# Patient Record
Sex: Female | Born: 1935 | Race: Black or African American | Hispanic: No | Smoking: Former smoker
Health system: Southern US, Community
[De-identification: ages and names within clinical notes are randomized; demographics above are authoritative.]

## PROBLEM LIST (undated history)

## (undated) DIAGNOSIS — M5410 Radiculopathy, site unspecified: Secondary | ICD-10-CM

## (undated) DIAGNOSIS — M1712 Unilateral primary osteoarthritis, left knee: Secondary | ICD-10-CM

## (undated) DIAGNOSIS — M47816 Spondylosis without myelopathy or radiculopathy, lumbar region: Secondary | ICD-10-CM

## (undated) DIAGNOSIS — F32A Depression, unspecified: Secondary | ICD-10-CM

## (undated) DIAGNOSIS — Z87442 Personal history of urinary calculi: Secondary | ICD-10-CM

## (undated) DIAGNOSIS — K219 Gastro-esophageal reflux disease without esophagitis: Secondary | ICD-10-CM

## (undated) DIAGNOSIS — F329 Major depressive disorder, single episode, unspecified: Secondary | ICD-10-CM

## (undated) DIAGNOSIS — I10 Essential (primary) hypertension: Secondary | ICD-10-CM

## (undated) DIAGNOSIS — E559 Vitamin D deficiency, unspecified: Secondary | ICD-10-CM

## (undated) DIAGNOSIS — N189 Chronic kidney disease, unspecified: Secondary | ICD-10-CM

## (undated) HISTORY — DX: Major depressive disorder, single episode, unspecified: F32.9

## (undated) HISTORY — PX: EYE SURGERY: SHX253

## (undated) HISTORY — DX: Vitamin D deficiency, unspecified: E55.9

## (undated) HISTORY — PX: COLONOSCOPY: SHX174

## (undated) HISTORY — DX: Essential (primary) hypertension: I10

## (undated) HISTORY — DX: Gastro-esophageal reflux disease without esophagitis: K21.9

## (undated) HISTORY — DX: Depression, unspecified: F32.A

---

## 1967-02-20 HISTORY — PX: BACK SURGERY: SHX140

## 2016-03-16 ENCOUNTER — Ambulatory Visit (INDEPENDENT_AMBULATORY_CARE_PROVIDER_SITE_OTHER): Payer: Medicare Other | Admitting: Internal Medicine

## 2016-03-16 ENCOUNTER — Encounter: Payer: Self-pay | Admitting: Internal Medicine

## 2016-03-16 VITALS — BP 180/86 | HR 59 | Ht 69.5 in | Wt 198.0 lb

## 2016-03-16 DIAGNOSIS — Z01810 Encounter for preprocedural cardiovascular examination: Secondary | ICD-10-CM | POA: Diagnosis not present

## 2016-03-16 DIAGNOSIS — I10 Essential (primary) hypertension: Secondary | ICD-10-CM | POA: Insufficient documentation

## 2016-03-16 DIAGNOSIS — R9431 Abnormal electrocardiogram [ECG] [EKG]: Secondary | ICD-10-CM | POA: Insufficient documentation

## 2016-03-16 NOTE — Patient Instructions (Addendum)
Your physician has requested that you have a lexiscan myoview. For further information please visit [email or web address]. Please follow instruction sheet, as given.  Please contact our office with the name/dose of the other medication you take.  [phone] You can leave this info with the operator  Your physician recommends that you schedule a follow-up appointment as needed - we will call you with your test results.

## 2016-03-16 NOTE — Progress Notes (Signed)
OFFICE NOTE  Chief Complaint:  Preop for left TKR  Primary Care Physician: Patria ManeGASSEMI, MIKE, MD  HPI:  Heather SoursBernice Viscardi is a 83 y.o. female coming referred by Dr. Thurston HoleWainer for evaluation of risk assessment for left total knee replacement. She reports no past cardiac history although has struggled with hypertension. She currently takes benazepril 40 mg and triamterene hydrochlorothiazide 37.5/25 mg daily. She also takes to her Zosyn 2 mg daily at bedtime and metoprolol succinate 100 mg daily. Initially blood pressure is 180/86 today in the office however came down to 140/70. There is no family history of coronary disease. She does not take medication for diabetes or have a history of dyslipidemia. Of note, her EKG is abnormal today showing sinus bradycardia at 59 with T-wave abnormalities laterally suggestive of possible ischemia. She's never had stress testing. Given her knee problem she is unable to walk on a treadmill.  PMHx:  Past Medical History:  Diagnosis Date  . Depression   . GERD (gastroesophageal reflux disease)   . Hypertension   . Vitamin D deficiency     History reviewed. No pertinent surgical history.  FAMHx:  Family History  Problem Relation Age of Onset  . Cancer Mother     SOCHx:   reports that she has never smoked. She has never used smokeless tobacco. She reports that she does not drink alcohol or use drugs.  ALLERGIES:  Allergies  Allergen Reactions  . Amlodipine     Other reaction(s): EDEMA    ROS: Pertinent items noted in HPI and remainder of comprehensive ROS otherwise negative.  HOME MEDS: Allergies as of 03/16/2016      Reactions   Amlodipine    Other reaction(s): EDEMA      Medication List       Accurate as of 03/16/16  5:02 PM. Always use your most recent med list.          acetaminophen 650 MG CR tablet Commonly known as:  TYLENOL Take 650 mg by mouth daily as needed for pain.   aspirin EC 81 MG tablet Take 81 mg by mouth daily.   benazepril 40 MG tablet Commonly known as:  LOTENSIN Take 40 mg by mouth daily.   citalopram 20 MG tablet Commonly known as:  CELEXA Take 20 mg by mouth daily.   meloxicam 7.5 MG tablet Commonly known as:  MOBIC Take 7.5 mg by mouth daily.   metoprolol succinate 100 MG 24 hr tablet Commonly known as:  TOPROL-XL Take 100 mg by mouth daily.   pantoprazole 40 MG tablet Commonly known as:  PROTONIX Take 40 mg by mouth daily.   terazosin 2 MG capsule Commonly known as:  HYTRIN Take 2 mg by mouth daily.   triamterene-hydrochlorothiazide 37.5-25 MG capsule Commonly known as:  DYAZIDE Take 1 capsule by mouth daily.   Vitamin D3 2000 units capsule Take 2,000 Units by mouth daily.      LABS/IMAGING: No results found for this or any previous visit (from the past 48 hour(s)). No results found.  WEIGHTS: Wt Readings from Last 3 Encounters:  03/16/16 198 lb (89.8 kg)    VITALS: BP (!) 180/86 (BP Location: Left Arm)   Pulse (!) 59   Ht 5' 9.5" (1.765 m)   Wt 198 lb (89.8 kg)   BMI 28.82 kg/m   EXAM: General appearance: alert and no distress Neck: no carotid bruit and no JVD Lungs: clear to auscultation bilaterally Heart: regular rate and rhythm Abdomen: soft, non-tender;  bowel sounds normal; no masses,  no organomegaly Extremities: extremities normal, atraumatic, no cyanosis or edema Pulses: 2+ and symmetric Skin: Skin color, texture, turgor normal. No rashes or lesions Neurologic: Grossly normal Psych: Pleasant  EKG: Sinus bradycardia 59, lateral T-wave changes suggestive of ischemia  ASSESSMENT: 1. Indeterminate preoperative risk for left total knee replacement 2. Ischemic EKG changes 3. Hypertension-controlled  PLAN: 1.   Heather Holt has increased risk for coronary disease based on age, hypertension and abnormal EKG with ischemic changes laterally. She denies any chest pain or worsening shortness of breath but has had recent fatigue particular doing  activities and caring for her son is ill as well as a friend. I like for her to undergo a Lexiscan Myoview since she cannot walk on a treadmill. If this is low risk and I would think she is at acceptable risk for total knee replacement. Hopefully we can perform her stress test next week. I'll be in contact with the results of the stress test.  Thanks again for the kind referral.  Chrystie Nose, MD, [facility] Attending Cardiologist [facility]  Lisette Abu [facility] 03/16/2016, 5:02 PM

## 2016-03-21 ENCOUNTER — Telehealth (HOSPITAL_COMMUNITY): Payer: Self-pay

## 2016-03-21 NOTE — Telephone Encounter (Signed)
Encounter complete. 

## 2016-03-22 ENCOUNTER — Telehealth: Payer: Self-pay | Admitting: Internal Medicine

## 2016-03-22 NOTE — Telephone Encounter (Signed)
[facility] requests clearance for:  1. Type of surgery: left total knee replacement 2. Date of surgery: April 30, 2016 3. Surgeon: Dr. Salvatore Marvelobert Wainer 4. Medications that need to be held & how long: none specified 5. Fax and/or Phone: (p) [phone] (f) [phone]  -- attn: Sherri  -- requesting copy of: recent note, EKG, cardiac studies -- stress test scheduled for Mar 23, 2016

## 2016-03-23 ENCOUNTER — Ambulatory Visit (HOSPITAL_COMMUNITY)
Admission: RE | Admit: 2016-03-23 | Discharge: 2016-03-23 | Disposition: A | Discharge status: Home / Self Care | Payer: Medicare Other | Source: Ambulatory Visit | Attending: Cardiology | Admitting: Cardiology

## 2016-03-23 DIAGNOSIS — R9431 Abnormal electrocardiogram [ECG] [EKG]: Secondary | ICD-10-CM | POA: Insufficient documentation

## 2016-03-23 DIAGNOSIS — Z01810 Encounter for preprocedural cardiovascular examination: Secondary | ICD-10-CM | POA: Diagnosis not present

## 2016-03-23 DIAGNOSIS — I10 Essential (primary) hypertension: Secondary | ICD-10-CM | POA: Insufficient documentation

## 2016-03-23 LAB — MYOCARDIAL PERFUSION IMAGING
CHL CUP NUCLEAR SDS: 2
CHL CUP NUCLEAR SRS: 0
CHL CUP NUCLEAR SSS: 2
LV dias vol: 132 mL (ref 46–106)
LVSYSVOL: 58 mL
NUC STRESS TID: 1.03
Peak HR: 73 {beats}/min
Rest HR: 54 {beats}/min

## 2016-03-23 MED ORDER — AMINOPHYLLINE 25 MG/ML IV SOLN
75.0000 mg | Freq: Once | INTRAVENOUS | Status: AC
  Administered 2016-03-23: 75 mg via INTRAVENOUS

## 2016-03-23 MED ORDER — TECHNETIUM TC 99M TETROFOSMIN IV KIT
9.6000 | PACK | Freq: Once | INTRAVENOUS | Status: AC | PRN
  Administered 2016-03-23: 9.6 via INTRAVENOUS
  Filled 2016-03-23: qty 10

## 2016-03-23 MED ORDER — TECHNETIUM TC 99M TETROFOSMIN IV KIT
30.5000 | PACK | Freq: Once | INTRAVENOUS | Status: AC | PRN
  Administered 2016-03-23: 30.5 via INTRAVENOUS
  Filled 2016-03-23: qty 31

## 2016-03-23 MED ORDER — REGADENOSON 0.4 MG/5ML IV SOLN
0.4000 mg | Freq: Once | INTRAVENOUS | Status: AC
  Administered 2016-03-23: 0.4 mg via INTRAVENOUS

## 2016-03-26 ENCOUNTER — Encounter: Payer: Self-pay | Admitting: Internal Medicine

## 2016-03-26 NOTE — Telephone Encounter (Signed)
Notes Recorded by Chrystie NoseKenneth C Hilty, MD on 03/23/2016 at 5:31 PM EST Low risk stress test -no ischemia. Suspected breast attenuation artifact. Normal LV function.

## 2016-03-26 NOTE — Telephone Encounter (Signed)
Based on stress test findings - clear for surgery. Low risk.  Dr. HRexene Edison

## 2016-03-26 NOTE — Telephone Encounter (Signed)
Clearance routed via EPIC 

## 2016-04-18 ENCOUNTER — Encounter (HOSPITAL_COMMUNITY): Payer: Self-pay | Admitting: Physician Assistant

## 2016-04-18 DIAGNOSIS — M1712 Unilateral primary osteoarthritis, left knee: Secondary | ICD-10-CM | POA: Diagnosis present

## 2016-04-18 DIAGNOSIS — M5410 Radiculopathy, site unspecified: Secondary | ICD-10-CM | POA: Diagnosis present

## 2016-04-18 DIAGNOSIS — M47816 Spondylosis without myelopathy or radiculopathy, lumbar region: Secondary | ICD-10-CM | POA: Diagnosis present

## 2016-04-18 DIAGNOSIS — E559 Vitamin D deficiency, unspecified: Secondary | ICD-10-CM | POA: Diagnosis present

## 2016-04-18 DIAGNOSIS — F32A Depression, unspecified: Secondary | ICD-10-CM | POA: Diagnosis present

## 2016-04-18 DIAGNOSIS — F329 Major depressive disorder, single episode, unspecified: Secondary | ICD-10-CM | POA: Diagnosis present

## 2016-04-18 DIAGNOSIS — K219 Gastro-esophageal reflux disease without esophagitis: Secondary | ICD-10-CM | POA: Diagnosis present

## 2016-04-18 NOTE — H&P (Addendum)
TOTAL KNEE ADMISSION H&P  Patient is being admitted for left total knee arthroplasty.  Subjective:  Chief Complaint:left knee pain.  HPI: Heather Holt, 83 y.o. female, has a history of pain and functional disability in the left knee due to arthritis and has failed non-surgical conservative treatments for greater than 12 weeks to includeNSAID's and/or analgesics, corticosteriod injections, viscosupplementation injections, flexibility and strengthening excercises, supervised PT with diminished ADL's post treatment, use of assistive devices, weight reduction as appropriate and activity modification.  Onset of symptoms was gradual, starting 10 years ago with gradually worsening course since that time. The patient noted no past surgery on the left knee(s).  Patient currently rates pain in the left knee(s) at 10 out of 10 with activity. Patient has night pain, worsening of pain with activity and weight bearing, pain that interferes with activities of daily living, crepitus and joint swelling.  Patient has evidence of subchondral sclerosis, periarticular osteophytes and joint space narrowing by imaging studies.  There is no active infection.  Patient Active Problem List   Diagnosis Date Noted  . Primary localized osteoarthrosis of the knee, left   . Radicular syndrome of left leg   . Osteoarthritis of lumbar spine   . Vitamin D deficiency   . GERD (gastroesophageal reflux disease)   . Depression   . Pre-operative cardiovascular examination 03/16/2016  . Essential hypertension, benign 03/16/2016  . Abnormal EKG 03/16/2016   Past Medical History:  Diagnosis Date  . Depression   . GERD (gastroesophageal reflux disease)   . Hypertension   . Osteoarthritis of lumbar spine   . Primary localized osteoarthrosis of the knee, left   . Radicular syndrome of left leg   . Vitamin D deficiency     Past Surgical History:  Procedure Laterality Date  . BACK SURGERY  1969    No current  facility-administered medications for this encounter.   Current Outpatient Prescriptions:  .  benazepril (LOTENSIN) 40 MG tablet, Take 40 mg by mouth daily., Disp: , Rfl:  .  cholecalciferol (VITAMIN D) 1000 units tablet, Take 1,000 Units by mouth daily., Disp: , Rfl:  .  meloxicam (MOBIC) 7.5 MG tablet, Take 7.5 mg by mouth daily., Disp: , Rfl:  .  metoprolol succinate (TOPROL-XL) 100 MG 24 hr tablet, Take 100 mg by mouth daily., Disp: , Rfl:  .  pantoprazole (PROTONIX) 40 MG tablet, Take 40 mg by mouth daily., Disp: , Rfl:  .  Potassium 99 MG TABS, Take 198 mg by mouth daily., Disp: , Rfl:  .  terazosin (HYTRIN) 2 MG capsule, Take 2 mg by mouth at bedtime. , Disp: , Rfl:  .  triamterene-hydrochlorothiazide (DYAZIDE) 37.5-25 MG capsule, Take 1 capsule by mouth daily., Disp: , Rfl:      Allergies  Allergen Reactions  . Amlodipine Swelling    EDEMA    Social History  Substance Use Topics  . Smoking status: Never Smoker  . Smokeless tobacco: Never Used  . Alcohol use No    Family History  Problem Relation Age of Onset  . Cancer Mother   . Diabetes Sister      Review of Systems  Constitutional: Negative.   HENT: Negative.   Eyes: Negative.   Respiratory: Negative.   Cardiovascular: Negative.   Gastrointestinal: Negative.   Genitourinary: Negative.   Musculoskeletal: Positive for back pain and joint pain.  Skin: Negative.   Neurological: Negative.   Endo/Heme/Allergies: Negative.   Psychiatric/Behavioral: Negative.     Objective:  Physical Exam  Constitutional: She is oriented to person, place, and time. She appears well-developed and well-nourished.  HENT:  Head: Normocephalic and atraumatic.  Mouth/Throat: Oropharynx is clear and moist.  Eyes: Conjunctivae are normal. Pupils are equal, round, and reactive to light.  Neck: Normal range of motion. Neck supple.  Cardiovascular: Normal rate and regular rhythm.   Respiratory: Effort normal and breath sounds normal.   GI: Soft. Bowel sounds are normal.  Genitourinary:  Genitourinary Comments: Not pertinent to current symptomatology therefore not examined.  Musculoskeletal:  She is independently ambulatory with a significantly valgus deformed knee on the left.  She has active range of motion 0-90 degrees. 2+ crepitus.  2+ synovitis. Lateral pain.  Ipsilateral straight leg raises on left negative contralateral SLR on right.  Right knee also have valgus deformity with 1+ crepitus, 1+ synovitis and pain today.  1+ DP pulses bilaterally   Neurological: She is alert and oriented to person, place, and time.  Skin: Skin is warm and dry.  Psychiatric: She has a normal mood and affect. Her behavior is normal.    Vital signs in last 24 hours: Temp:  [98.2 F (36.8 C)] 98.2 F (36.8 C) (02/28 1500) Pulse Rate:  [60] 60 (02/28 1500) BP: (192)/(77) 192/77 (02/28 1500) SpO2:  [96 %] 96 % (02/28 1500) Weight:  [89.6 kg (197 lb 9.6 oz)] 89.6 kg (197 lb 9.6 oz) (02/28 1500)  Labs:   Estimated body mass index is 29.18 kg/m as calculated from the following:   Height as of this encounter: 5\' 9"  (1.753 m).   Weight as of this encounter: 89.6 kg (197 lb 9.6 oz).   Imaging Review Plain radiographs demonstrate severe degenerative joint disease of the left knee(s). The overall alignment issignificant valgus. The bone quality appears to be fair for age and reported activity level.  Assessment/Plan:  End stage arthritis, left knee  Principal Problem:   Primary localized osteoarthrosis of the knee, left Active Problems:   Essential hypertension, benign   Abnormal EKG   Radicular syndrome of left leg   Osteoarthritis of lumbar spine   Vitamin D deficiency   GERD (gastroesophageal reflux disease)   Depression   The patient history, physical examination, clinical judgment of the provider and imaging studies are consistent with end stage degenerative joint disease of the left knee(s) and total knee arthroplasty is  deemed medically necessary. The treatment options including medical management, injection therapy arthroscopy and arthroplasty were discussed at length. The risks and benefits of total knee arthroplasty were presented and reviewed. The risks due to aseptic loosening, infection, stiffness, patella tracking problems, thromboembolic complications and other imponderables were discussed. The patient acknowledged the explanation, agreed to proceed with the plan and consent was signed. Patient is being admitted for inpatient treatment for surgery, pain control, PT, OT, prophylactic antibiotics, VTE prophylaxis, progressive ambulation and ADL's and discharge planning. The patient is planning to be discharged home with home health services her son is staying with her.

## 2016-04-20 ENCOUNTER — Encounter (HOSPITAL_COMMUNITY)
Admission: RE | Admit: 2016-04-20 | Discharge: 2016-04-20 | Disposition: A | Discharge status: Home / Self Care | Payer: Medicare Other | Source: Ambulatory Visit | Attending: Orthopedic Surgery | Admitting: Orthopedic Surgery

## 2016-04-20 ENCOUNTER — Other Ambulatory Visit (HOSPITAL_COMMUNITY): Payer: Self-pay | Admitting: *Deleted

## 2016-04-20 ENCOUNTER — Encounter (HOSPITAL_COMMUNITY): Payer: Self-pay

## 2016-04-20 DIAGNOSIS — Z79899 Other long term (current) drug therapy: Secondary | ICD-10-CM | POA: Insufficient documentation

## 2016-04-20 DIAGNOSIS — K219 Gastro-esophageal reflux disease without esophagitis: Secondary | ICD-10-CM | POA: Insufficient documentation

## 2016-04-20 DIAGNOSIS — Z01812 Encounter for preprocedural laboratory examination: Secondary | ICD-10-CM | POA: Insufficient documentation

## 2016-04-20 DIAGNOSIS — Z87891 Personal history of nicotine dependence: Secondary | ICD-10-CM | POA: Insufficient documentation

## 2016-04-20 DIAGNOSIS — I10 Essential (primary) hypertension: Secondary | ICD-10-CM | POA: Diagnosis not present

## 2016-04-20 HISTORY — DX: Personal history of urinary calculi: Z87.442

## 2016-04-20 LAB — COMPREHENSIVE METABOLIC PANEL
ALT: 12 U/L — ABNORMAL LOW (ref 14–54)
ANION GAP: 10 (ref 5–15)
AST: 18 U/L (ref 15–41)
Albumin: 4.2 g/dL (ref 3.5–5.0)
Alkaline Phosphatase: 56 U/L (ref 38–126)
BUN: 18 mg/dL (ref 6–20)
CHLORIDE: 108 mmol/L (ref 101–111)
CO2: 22 mmol/L (ref 22–32)
Calcium: 9.5 mg/dL (ref 8.9–10.3)
Creatinine, Ser: 1.77 mg/dL — ABNORMAL HIGH (ref 0.44–1.00)
GFR, EST AFRICAN AMERICAN: 29 mL/min — AB (ref >60)
GFR, EST NON AFRICAN AMERICAN: 25 mL/min — AB (ref >60)
Glucose, Bld: 97 mg/dL (ref 65–99)
POTASSIUM: 4.1 mmol/L (ref 3.5–5.1)
Sodium: 140 mmol/L (ref 135–145)
Total Bilirubin: 0.4 mg/dL (ref 0.3–1.2)
Total Protein: 7.3 g/dL (ref 6.5–8.1)

## 2016-04-20 LAB — CBC WITH DIFFERENTIAL/PLATELET
Basophils Absolute: 0 10*3/uL (ref 0.0–0.1)
Basophils Relative: 1 %
EOS ABS: 0.1 10*3/uL (ref 0.0–0.7)
EOS PCT: 3 %
HCT: 36.8 % (ref 36.0–46.0)
Hemoglobin: 11.8 g/dL — ABNORMAL LOW (ref 12.0–15.0)
LYMPHS ABS: 1.9 10*3/uL (ref 0.7–4.0)
Lymphocytes Relative: 40 %
MCH: 27.6 pg (ref 26.0–34.0)
MCHC: 32.1 g/dL (ref 30.0–36.0)
MCV: 86 fL (ref 78.0–100.0)
MONO ABS: 0.5 10*3/uL (ref 0.1–1.0)
Monocytes Relative: 10 %
Neutro Abs: 2.2 10*3/uL (ref 1.7–7.7)
Neutrophils Relative %: 46 %
PLATELETS: 220 10*3/uL (ref 150–400)
RBC: 4.28 MIL/uL (ref 3.87–5.11)
RDW: 13.5 % (ref 11.5–15.5)
WBC: 4.7 10*3/uL (ref 4.0–10.5)

## 2016-04-20 LAB — TYPE AND SCREEN
ABO/RH(D): B POS
Antibody Screen: NEGATIVE

## 2016-04-20 LAB — SURGICAL PCR SCREEN
MRSA, PCR: NEGATIVE
Staphylococcus aureus: NEGATIVE

## 2016-04-20 LAB — ABO/RH: ABO/RH(D): B POS

## 2016-04-20 LAB — PROTIME-INR
INR: 1
PROTHROMBIN TIME: 13.2 s (ref 11.4–15.2)

## 2016-04-20 LAB — APTT: aPTT: 30 seconds (ref 24–36)

## 2016-04-20 NOTE — Pre-Procedure Instructions (Signed)
Wynonia SoursBernice Kautzman  04/20/2016    Your procedure is scheduled on  Monday, April 30, 2016 at 10:30 AM.   [facility] at 8:30 AM.   Call this number if you have problems the morning of surgery: [phone]   Questions prior to day of surgery, please call [phone] between 8 & 4 PM.   Remember:  Do not eat food or drink liquids after midnight Sunday, 04/29/16.  Take these medicines the morning of surgery with A SIP OF WATER: Metoprolol (Toprol XL), Pantoprazole (Protonix)  Stop NSAIDS (Meloxicam, Aleve, Ibuprofen, etc) 5 days prior to surgery. Do not use Aspirin products 5 days prior to surgery.   Do not wear jewelry, make-up or nail polish.  Do not wear lotions, powders or perfumes.  Do not shave 48 hours prior to surgery.    Do not bring valuables to the hospital.  [facility] is not responsible for any belongings or valuables.  Contacts, dentures or bridgework may not be worn into surgery.  Leave your suitcase in the car.  After surgery it may be brought to your room.  For patients admitted to the hospital, discharge time will be determined by your treatment team.  Special instructions:  [place] - Preparing for Surgery  Before surgery, you can play an important role.  Because skin is not sterile, your skin needs to be as free of germs as possible.  You can reduce the number of germs on you skin by washing with CHG (chlorahexidine gluconate) soap before surgery.  CHG is an antiseptic cleaner which kills germs and bonds with the skin to continue killing germs even after washing.  Please DO NOT use if you have an allergy to CHG or antibacterial soaps.  If your skin becomes reddened/irritated stop using the CHG and inform your nurse when you arrive at Short Stay.  Do not shave (including legs and underarms) for at least 48 hours prior to the first CHG shower.  You may shave your face.  Please follow these instructions  carefully:   1.  Shower with CHG Soap the night before surgery and the                    morning of Surgery.  2.  If you choose to wash your hair, wash your hair first as usual with your       normal shampoo.  3.  After you shampoo, rinse your hair and body thoroughly to remove the shampoo.  4.  Use CHG as you would any other liquid soap.  You can apply chg directly       to the skin and wash gently with scrungie or a clean washcloth.  5.  Apply the CHG Soap to your body ONLY FROM THE NECK DOWN.        Do not use on open wounds or open sores.  Avoid contact with your eyes, ears, mouth and genitals (private parts).  Wash genitals (private parts) with your normal soap.  6.  Wash thoroughly, paying special attention to the area where your surgery        will be performed.  7.  Thoroughly rinse your body with warm water from the neck down.  8.  DO NOT shower/wash with your normal soap after using and rinsing off       the CHG Soap.  9.  Pat yourself dry with a clean towel.  10.  Wear clean pajamas.            11.  Place clean sheets on your bed the night of your first shower and do not        sleep with pets.  Day of Surgery  Do not apply any lotions the morning of surgery.  Please wear clean clothes to the hospital.   Please read over the fact sheets that you were given.

## 2016-04-20 NOTE — Progress Notes (Signed)
Pt denies cardiac history, chest pain or sob. Saw Dr. Rennis GoldenHilty in February to have cardiac clearance done. Had stress test done then. Cardiac clearance is noted in EPIC.

## 2016-04-21 LAB — URINE CULTURE: Culture: <10000 — AB

## 2016-04-23 ENCOUNTER — Inpatient Hospital Stay (HOSPITAL_COMMUNITY): Payer: Medicare Other | Admitting: Emergency Medicine

## 2016-04-23 NOTE — Progress Notes (Signed)
Anesthesia Chart Review:  Pt is an 83 year old female scheduled for L total knee arthroplasty on 04/30/2016 with Salvatore Marvelobert Wainer, MD.   - PCP is Patria ManeMike Gassemi, MD, who cleared pt for surgery at last office visit 04/18/16 (notes in care everywhere) - Pt saw Zoila ShutterKenneth Hilty, MD with cardiology 03/16/16 for pre-op eval. Stress test ordered, results below.   PMH includes:  HTN, GERD. Former smoker. BMI 29  Medications include: benazepril, metoprolol, protonix, potassium, terazosin, dyazide  Preoperative labs reviewed.  Cr 1.77, BUN 18.  Cr ranged 1.34-1.49 over last 12 months (in care everywhere).  Cr was 1.34 on 03/20/16.  Will recheck renal function DOS.   EKG 03/16/16: sinus bradycardia (59 bpm). T wave abnormality, consider lateral ischemia.   Nuclear stress test 03/23/16:   Nuclear stress EF: 56%.  The left ventricular ejection fraction is normal (55-65%).  There was accentuation of the T wave abnormality in the inferolateral leads during infusion  There is a small defect present in the apical anterior and apical septal location. The defect is partially reversible. This is likely due to variations in breast attenuation artifact.  This is a low risk study.  If labs acceptable DOS, I anticipate pt can proceed as scheduled.   Rica Mastngela Jlyn Cerros, FNP-BC [facility] Phone: [phone] 04/23/2016 4:53 PM

## 2016-04-27 MED ORDER — CEFAZOLIN SODIUM-DEXTROSE 2-4 GM/100ML-% IV SOLN
2.0000 g | INTRAVENOUS | Status: DC
  Filled 2016-04-27: qty 100

## 2016-04-27 MED ORDER — LACTATED RINGERS IV SOLN
INTRAVENOUS | Status: DC
  Administered 2016-04-30: 09:00:00 via INTRAVENOUS

## 2016-04-30 ENCOUNTER — Observation Stay (HOSPITAL_COMMUNITY)
Admission: RE | Admit: 2016-04-30 | Discharge: 2016-05-01 | Disposition: A | Discharge status: Home / Self Care | Payer: Medicare Other | Source: Ambulatory Visit | Attending: Internal Medicine | Admitting: Internal Medicine

## 2016-04-30 ENCOUNTER — Encounter (HOSPITAL_COMMUNITY)
Admission: RE | Disposition: A | Discharge status: Home / Self Care | Payer: Self-pay | Source: Ambulatory Visit | Attending: Family Medicine

## 2016-04-30 ENCOUNTER — Encounter (HOSPITAL_COMMUNITY): Payer: Self-pay | Admitting: *Deleted

## 2016-04-30 DIAGNOSIS — Z791 Long term (current) use of non-steroidal anti-inflammatories (NSAID): Secondary | ICD-10-CM | POA: Insufficient documentation

## 2016-04-30 DIAGNOSIS — R519 Headache, unspecified: Secondary | ICD-10-CM | POA: Diagnosis present

## 2016-04-30 DIAGNOSIS — N182 Chronic kidney disease, stage 2 (mild): Secondary | ICD-10-CM | POA: Diagnosis not present

## 2016-04-30 DIAGNOSIS — M479 Spondylosis, unspecified: Secondary | ICD-10-CM | POA: Insufficient documentation

## 2016-04-30 DIAGNOSIS — I129 Hypertensive chronic kidney disease with stage 1 through stage 4 chronic kidney disease, or unspecified chronic kidney disease: Secondary | ICD-10-CM | POA: Insufficient documentation

## 2016-04-30 DIAGNOSIS — Z9841 Cataract extraction status, right eye: Secondary | ICD-10-CM | POA: Insufficient documentation

## 2016-04-30 DIAGNOSIS — R51 Headache: Secondary | ICD-10-CM | POA: Diagnosis not present

## 2016-04-30 DIAGNOSIS — M1712 Unilateral primary osteoarthritis, left knee: Secondary | ICD-10-CM | POA: Insufficient documentation

## 2016-04-30 DIAGNOSIS — Z79899 Other long term (current) drug therapy: Secondary | ICD-10-CM | POA: Diagnosis not present

## 2016-04-30 DIAGNOSIS — I161 Hypertensive emergency: Secondary | ICD-10-CM | POA: Diagnosis present

## 2016-04-30 DIAGNOSIS — Z9842 Cataract extraction status, left eye: Secondary | ICD-10-CM | POA: Insufficient documentation

## 2016-04-30 DIAGNOSIS — F329 Major depressive disorder, single episode, unspecified: Secondary | ICD-10-CM | POA: Diagnosis not present

## 2016-04-30 DIAGNOSIS — F32A Depression, unspecified: Secondary | ICD-10-CM | POA: Diagnosis present

## 2016-04-30 DIAGNOSIS — Z87891 Personal history of nicotine dependence: Secondary | ICD-10-CM | POA: Diagnosis not present

## 2016-04-30 DIAGNOSIS — M47816 Spondylosis without myelopathy or radiculopathy, lumbar region: Secondary | ICD-10-CM | POA: Diagnosis present

## 2016-04-30 DIAGNOSIS — K219 Gastro-esophageal reflux disease without esophagitis: Secondary | ICD-10-CM | POA: Diagnosis not present

## 2016-04-30 DIAGNOSIS — M47896 Other spondylosis, lumbar region: Secondary | ICD-10-CM | POA: Diagnosis not present

## 2016-04-30 DIAGNOSIS — Z809 Family history of malignant neoplasm, unspecified: Secondary | ICD-10-CM | POA: Insufficient documentation

## 2016-04-30 DIAGNOSIS — Z833 Family history of diabetes mellitus: Secondary | ICD-10-CM | POA: Insufficient documentation

## 2016-04-30 DIAGNOSIS — I160 Hypertensive urgency: Secondary | ICD-10-CM | POA: Diagnosis not present

## 2016-04-30 DIAGNOSIS — Z9889 Other specified postprocedural states: Secondary | ICD-10-CM | POA: Insufficient documentation

## 2016-04-30 DIAGNOSIS — N183 Chronic kidney disease, stage 3 (moderate): Secondary | ICD-10-CM | POA: Diagnosis not present

## 2016-04-30 DIAGNOSIS — N189 Chronic kidney disease, unspecified: Secondary | ICD-10-CM | POA: Diagnosis present

## 2016-04-30 DIAGNOSIS — E559 Vitamin D deficiency, unspecified: Secondary | ICD-10-CM | POA: Insufficient documentation

## 2016-04-30 DIAGNOSIS — Z87442 Personal history of urinary calculi: Secondary | ICD-10-CM | POA: Insufficient documentation

## 2016-04-30 DIAGNOSIS — Z888 Allergy status to other drugs, medicaments and biological substances status: Secondary | ICD-10-CM | POA: Insufficient documentation

## 2016-04-30 DIAGNOSIS — M5410 Radiculopathy, site unspecified: Secondary | ICD-10-CM | POA: Insufficient documentation

## 2016-04-30 HISTORY — DX: Chronic kidney disease, unspecified: N18.9

## 2016-04-30 HISTORY — DX: Spondylosis without myelopathy or radiculopathy, lumbar region: M47.816

## 2016-04-30 HISTORY — DX: Radiculopathy, site unspecified: M54.10

## 2016-04-30 HISTORY — DX: Unilateral primary osteoarthritis, left knee: M17.12

## 2016-04-30 LAB — POCT I-STAT, CHEM 8
BUN: 15 mg/dL (ref 6–20)
Calcium, Ion: 1.16 mmol/L (ref 1.15–1.40)
Chloride: 107 mmol/L (ref 101–111)
Creatinine, Ser: 1.3 mg/dL — ABNORMAL HIGH (ref 0.44–1.00)
GLUCOSE: 101 mg/dL — AB (ref 65–99)
HCT: 35 % — ABNORMAL LOW (ref 36.0–46.0)
HEMOGLOBIN: 11.9 g/dL — AB (ref 12.0–15.0)
POTASSIUM: 3.8 mmol/L (ref 3.5–5.1)
Sodium: 142 mmol/L (ref 135–145)
TCO2: 24 mmol/L (ref 0–100)

## 2016-04-30 LAB — CBC
HCT: 35.5 % — ABNORMAL LOW (ref 36.0–46.0)
HEMOGLOBIN: 11.1 g/dL — AB (ref 12.0–15.0)
MCH: 26.9 pg (ref 26.0–34.0)
MCHC: 31.3 g/dL (ref 30.0–36.0)
MCV: 86.2 fL (ref 78.0–100.0)
PLATELETS: 207 10*3/uL (ref 150–400)
RBC: 4.12 MIL/uL (ref 3.87–5.11)
RDW: 13.9 % (ref 11.5–15.5)
WBC: 5.9 10*3/uL (ref 4.0–10.5)

## 2016-04-30 SURGERY — ARTHROPLASTY, KNEE, TOTAL
Anesthesia: Regional | Laterality: Left

## 2016-04-30 MED ORDER — HYDRALAZINE HCL 20 MG/ML IJ SOLN
10.0000 mg | INTRAMUSCULAR | Status: AC
  Administered 2016-04-30: 10 mg via INTRAVENOUS

## 2016-04-30 MED ORDER — BENAZEPRIL HCL 40 MG PO TABS
40.0000 mg | ORAL_TABLET | ORAL | Status: AC
  Administered 2016-04-30: 40 mg via ORAL
  Filled 2016-04-30: qty 1

## 2016-04-30 MED ORDER — POTASSIUM 99 MG PO TABS: 198.0000 mg | ORAL_TABLET | Freq: Every day | ORAL | Status: DC

## 2016-04-30 MED ORDER — BENAZEPRIL HCL 40 MG PO TABS: 40.0000 mg | ORAL_TABLET | Freq: Every day | ORAL | Status: DC

## 2016-04-30 MED ORDER — MELOXICAM 7.5 MG PO TABS: 7.5000 mg | ORAL_TABLET | Freq: Every day | ORAL | Status: DC

## 2016-04-30 MED ORDER — CHLORHEXIDINE GLUCONATE 4 % EX LIQD: 60.0000 mL | Freq: Once | CUTANEOUS | Status: DC

## 2016-04-30 MED ORDER — ENOXAPARIN SODIUM 40 MG/0.4ML ~~LOC~~ SOLN
40.0000 mg | SUBCUTANEOUS | Status: DC
  Administered 2016-04-30: 40 mg via SUBCUTANEOUS
  Filled 2016-04-30: qty 0.4

## 2016-04-30 MED ORDER — PANTOPRAZOLE SODIUM 40 MG PO TBEC
40.0000 mg | DELAYED_RELEASE_TABLET | Freq: Every day | ORAL | Status: DC
  Administered 2016-05-01: 40 mg via ORAL
  Filled 2016-04-30: qty 1

## 2016-04-30 MED ORDER — MIDAZOLAM HCL 2 MG/2ML IJ SOLN
INTRAMUSCULAR | Status: AC
  Filled 2016-04-30: qty 2

## 2016-04-30 MED ORDER — METOPROLOL SUCCINATE ER 100 MG PO TB24
100.0000 mg | ORAL_TABLET | Freq: Every day | ORAL | Status: DC
  Administered 2016-05-01: 100 mg via ORAL
  Filled 2016-04-30: qty 1

## 2016-04-30 MED ORDER — HYDROCODONE-ACETAMINOPHEN 5-325 MG PO TABS: 1.0000 | ORAL_TABLET | ORAL | Status: DC | PRN

## 2016-04-30 MED ORDER — VITAMIN D 1000 UNITS PO TABS
1000.0000 [IU] | ORAL_TABLET | Freq: Every day | ORAL | Status: DC
  Administered 2016-05-01: 1000 [IU] via ORAL
  Filled 2016-04-30: qty 1

## 2016-04-30 MED ORDER — FENTANYL CITRATE (PF) 100 MCG/2ML IJ SOLN
INTRAMUSCULAR | Status: AC
  Filled 2016-04-30: qty 2

## 2016-04-30 MED ORDER — TERAZOSIN HCL 2 MG PO CAPS
2.0000 mg | ORAL_CAPSULE | Freq: Every day | ORAL | Status: DC
  Administered 2016-04-30: 2 mg via ORAL
  Filled 2016-04-30: qty 1

## 2016-04-30 MED ORDER — BENAZEPRIL HCL 40 MG PO TABS
40.0000 mg | ORAL_TABLET | Freq: Every day | ORAL | Status: DC
  Administered 2016-05-01: 40 mg via ORAL
  Filled 2016-04-30: qty 4
  Filled 2016-04-30: qty 1

## 2016-04-30 MED ORDER — DEXAMETHASONE SODIUM PHOSPHATE 10 MG/ML IJ SOLN
INTRAMUSCULAR | Status: AC
  Filled 2016-04-30: qty 1

## 2016-04-30 MED ORDER — ACETAMINOPHEN 650 MG RE SUPP: 650.0000 mg | Freq: Four times a day (QID) | RECTAL | Status: DC | PRN

## 2016-04-30 MED ORDER — TRIAMTERENE-HCTZ 37.5-25 MG PO CAPS
1.0000 | ORAL_CAPSULE | Freq: Every day | ORAL | Status: DC
  Administered 2016-04-30 – 2016-05-01 (×2): 1 via ORAL
  Filled 2016-04-30 (×2): qty 1

## 2016-04-30 MED ORDER — SODIUM CHLORIDE 0.9% FLUSH
3.0000 mL | Freq: Two times a day (BID) | INTRAVENOUS | Status: DC
  Administered 2016-04-30 – 2016-05-01 (×3): 3 mL via INTRAVENOUS

## 2016-04-30 MED ORDER — POVIDONE-IODINE 7.5 % EX SOLN
Freq: Once | CUTANEOUS | Status: DC
  Filled 2016-04-30: qty 118

## 2016-04-30 MED ORDER — ONDANSETRON HCL 4 MG PO TABS: 4.0000 mg | ORAL_TABLET | Freq: Four times a day (QID) | ORAL | Status: DC | PRN

## 2016-04-30 MED ORDER — HYDRALAZINE HCL 20 MG/ML IJ SOLN: 5.0000 mg | INTRAMUSCULAR | Status: DC | PRN

## 2016-04-30 MED ORDER — ACETAMINOPHEN 325 MG PO TABS: 650.0000 mg | ORAL_TABLET | Freq: Four times a day (QID) | ORAL | Status: DC | PRN

## 2016-04-30 MED ORDER — TRAZODONE 25 MG HALF TABLET
25.0000 mg | ORAL_TABLET | Freq: Every evening | ORAL | Status: DC | PRN
  Filled 2016-04-30 (×2): qty 1

## 2016-04-30 MED ORDER — ONDANSETRON HCL 4 MG/2ML IJ SOLN: 4.0000 mg | Freq: Four times a day (QID) | INTRAMUSCULAR | Status: DC | PRN

## 2016-04-30 NOTE — Interval H&P Note (Signed)
.  H&P done

## 2016-04-30 NOTE — Progress Notes (Addendum)
Patient remains hypertensive above discharge parameter. Spoke to Dr. Renold DonGermeroth. [facility] Renold DonGermeroth. Patient notified of same. Patient complaint of headache.

## 2016-04-30 NOTE — Progress Notes (Signed)
Per Dr. Renold DonGermeroth case cancelled due to BP. Can discharge if BP < 190 SBP and instruct patient not to take benazepril at home.

## 2016-04-30 NOTE — H&P (Signed)
History and Physical    Heather Holt [id number] DOB: 21-May-1930 DOA: 04/30/2016  PCP: Patria ManeGASSEMI, MIKE, MD Patient coming from: home  Chief Complaint: hypertensive emergency  HPI: Heather Holt is a very pleasant 83 y.o. female with medical history significant for vitamin D deficiency, osteoarthritis of the lumbar spine, hypertension, GERD, chronic kidney disease, left knee pain scheduled for a knee replacement today is admitted from postanesthesia care unit secondary to hypertensive emergency.  Information is obtained from the patient and her family his at the bedside. Family indicate patient had epidural injection the lumbar spine last week. She was given instructions to hold one of her medications for 5 days postprocedure. She reports she Haldol other medications for 5 days. Antihypertensive medications include benazepril, metoprolol,triamterene and HCTZ. While in short stay being evaluated preoperatively it was noted her blood pressure was 219/84. Per the chart patient complained of a headache. She was provided with hydralazine and benazepril. At the time of admission blood pressure 214/69 patient was asymptomatic. She denies headache visual disturbances dizziness syncope or near-syncope. She states she ambulated to the bathroom just prior to my arrival and with the exception of pain in her left knee she felt "fine". She denies chest pain palpitation shortness of breath worse remedy edema or orthopnea. She denies abdominal pain nausea vomiting diarrhea constipation melena bright red blood per rectum. She denies dysuria hematuria frequency or urgency.    ED Course: Patient is in the PACU she was provided with denies a pro-and hydralazine. She is afebrile and not hypoxic with a heart rate of 60  Review of Systems: As per HPI otherwise 10 point review of systems negative.   Ambulatory Status: Patient ambulates with a cane and has frequent falls due to osteoarthritis in her knees. She denies  any recent injury  Past Medical History:  Diagnosis Date  . CKD (chronic kidney disease)   . Depression   . GERD (gastroesophageal reflux disease)   . History of kidney stones   . Hypertension   . Osteoarthritis of lumbar spine   . Primary localized osteoarthrosis of the knee, left   . Radicular syndrome of left leg   . Vitamin D deficiency     Past Surgical History:  Procedure Laterality Date  . BACK SURGERY  1969  . COLONOSCOPY    . EYE SURGERY Bilateral    cataract surgery    Social History   Social History  . Marital status: Widowed    Spouse name: N/A  . Number of children: N/A  . Years of education: N/A   Occupational History  . Not on file.   Social History Main Topics  . Smoking status: Former Smoker    Types: Cigarettes    Quit date: 04/21/1986  . Smokeless tobacco: Never Used  . Alcohol use No  . Drug use: No  . Sexual activity: Not on file   Other Topics Concern  . Not on file   Social History Narrative  . No narrative on file    Allergies  Allergen Reactions  . Amlodipine Swelling    SWELLING REACTION, EDEMA UNSPECIFIED    Family History  Problem Relation Age of Onset  . Cancer Mother   . Diabetes Sister     Prior to Admission medications   Medication Sig Start Date End Date Taking? Authorizing Provider  benazepril (LOTENSIN) 40 MG tablet Take 40 mg by mouth daily. 01/31/16  Yes Historical Provider, MD  cholecalciferol (VITAMIN D) 1000 units tablet Take 1,000 Units by  mouth daily.   Yes Historical Provider, MD  meloxicam (MOBIC) 7.5 MG tablet Take 7.5 mg by mouth daily. 01/13/16  Yes Historical Provider, MD  metoprolol succinate (TOPROL-XL) 100 MG 24 hr tablet Take 100 mg by mouth daily. 11/07/15  Yes Historical Provider, MD  pantoprazole (PROTONIX) 40 MG tablet Take 40 mg by mouth daily. 01/23/16  Yes Historical Provider, MD  Potassium 99 MG TABS Take 198 mg by mouth daily.   Yes Historical Provider, MD  terazosin (HYTRIN) 2 MG capsule  Take 2 mg by mouth at bedtime.  02/22/16  Yes Historical Provider, MD  triamterene-hydrochlorothiazide (DYAZIDE) 37.5-25 MG capsule Take 1 capsule by mouth daily. 09/26/15  Yes Historical Provider, MD    Physical Exam: Vitals:   04/30/16 1235 04/30/16 1240 04/30/16 1245 04/30/16 1250  BP:   (!) 198/73   Pulse: 60 64 60   Resp: 19 16 18 15   Temp:      SpO2: 100% 100% 100%   Weight:      Height:         General:  Appears calm and comfortable Sitting on side of the bed Eyes:  PERRL, EOMI, normal lids, iris ENT:  grossly normal hearing, lips & tongue, mmm Neck:  no LAD, masses or thyromegaly Cardiovascular:  RRR, no m/r/g. No LE edema. Pedal pulses present and palpable Respiratory:  CTA bilaterally, no w/r/r. Normal respiratory effort. Abdomen:  soft, ntnd, positive bowel sounds no guarding or rebounding Skin:  no rash or induration seen on limited exam Musculoskeletal:  grossly normal tone BUE/BLE, good ROM, no bony abnormality Psychiatric:  grossly normal mood and affect, speech fluent and appropriate, AOx3 Neurologic:  CN 2-12 grossly intact, moves all extremities in coordinated fashion, sensation intact  Labs on Admission: I have personally reviewed following labs and imaging studies  CBC:  Recent Labs Lab 04/30/16 0857 04/30/16 1155  WBC  --  5.9  HGB 11.9* 11.1*  HCT 35.0* 35.5*  MCV  --  86.2  PLT  --  207   Basic Metabolic Panel:  Recent Labs Lab 04/30/16 0857  NA 142  K 3.8  CL 107  GLUCOSE 101*  BUN 15  CREATININE 1.30*   GFR: Estimated Creatinine Clearance: 39.1 mL/min (by C-G formula based on SCr of 1.3 mg/dL (H)). Liver Function Tests: No results for input(s): AST, ALT, ALKPHOS, BILITOT, PROT, ALBUMIN in the last 168 hours. No results for input(s): LIPASE, AMYLASE in the last 168 hours. No results for input(s): AMMONIA in the last 168 hours. Coagulation Profile: No results for input(s): INR, PROTIME in the last 168 hours. Cardiac Enzymes: No  results for input(s): CKTOTAL, CKMB, CKMBINDEX, TROPONINI in the last 168 hours. BNP (last 3 results) No results for input(s): PROBNP in the last 8760 hours. HbA1C: No results for input(s): HGBA1C in the last 72 hours. CBG: No results for input(s): GLUCAP in the last 168 hours. Lipid Profile: No results for input(s): CHOL, HDL, LDLCALC, TRIG, CHOLHDL, LDLDIRECT in the last 72 hours. Thyroid Function Tests: No results for input(s): TSH, T4TOTAL, FREET4, T3FREE, THYROIDAB in the last 72 hours. Anemia Panel: No results for input(s): VITAMINB12, FOLATE, FERRITIN, TIBC, IRON, RETICCTPCT in the last 72 hours. Urine analysis: No results found for: COLORURINE, APPEARANCEUR, LABSPEC, PHURINE, GLUCOSEU, HGBUR, BILIRUBINUR, KETONESUR, PROTEINUR, UROBILINOGEN, NITRITE, LEUKOCYTESUR  Creatinine Clearance: Estimated Creatinine Clearance: 39.1 mL/min (by C-G formula based on SCr of 1.3 mg/dL (H)).  Sepsis Labs: @LABRCNTIP (procalcitonin:4,lacticidven:4) ) Recent Results (from the past 240 hour(s))  Surgical  pcr screen     Status: None   Collection Time: 04/20/16  2:01 PM  Result Value Ref Range Status   MRSA, PCR NEGATIVE NEGATIVE Final   Staphylococcus aureus NEGATIVE NEGATIVE Final    Comment:        The Xpert SA Assay (FDA approved for NASAL specimens in patients over 82 years of age), is one component of a comprehensive surveillance program.  Test performance has been validated by [facility] for patients greater than or equal to 16 year old. It is not intended to diagnose infection nor to guide or monitor treatment.   Urine culture     Status: Abnormal   Collection Time: 04/20/16  2:01 PM  Result Value Ref Range Status   Specimen Description URINE, CLEAN CATCH  Final   Special Requests NONE  Final   Culture <10,000 COLONIES/mL INSIGNIFICANT GROWTH (A)  Final   Report Status 04/21/2016 FINAL  Final     Radiological Exams on Admission: No results found.  EKG:    Assessment/Plan Principal Problem:   Hypertensive emergency Active Problems:   Primary localized osteoarthrosis of the knee, left   Radicular syndrome of left leg   Osteoarthritis of lumbar spine   Vitamin D deficiency   GERD (gastroesophageal reflux disease)   Depression   Headache   CKD (chronic kidney disease)   #1. Hypertensive emergency. Patient has not taken any of her antihypertensive meds for 5 days. She misunderstood post procedure instructions last week when she had epidural injection in her lumbar spine. Home medications include, benazepril, metoprolol, triamterene and hydrochlorothiazide -We'll continue her home medications -Monitor closely -Likely discharge to home tomorrow  #2. Chronic kidney disease stage II. Creatinine 1.3 on admission. -Monitor urine output -Hold nephrotoxins  #3. Osteoarthritis of the lumbar spine. Table at baseline. She had an epidural injection last week.  4. GERD. Stable at baseline -Continue home meds  #5. Primary localized osteoarthritis of the left knee. Scheduled for a knee replacement today. This was canceled secondary to #1. -Supportive therapy -will need to call orthopedics to reschedule    DVT prophylaxis: scd  Code Status: full  Family Communication: sisters at bedside  Disposition Plan: home  Consults called: none  Admission status: obs    Gwenyth Bender MD [facility]  If 7PM-7AM, please contact night-coverage [email or web address] Password [email or web address]  04/30/2016, 1:01 PM

## 2016-04-30 NOTE — Progress Notes (Signed)
Dr. Renold DonGermeroth notified of BP. At bedside

## 2016-04-30 NOTE — Anesthesia Preprocedure Evaluation (Signed)
Anesthesia Evaluation  Patient identified by MRN, date of birth, ID band Patient awake    Reviewed: Allergy & Precautions, NPO status , Patient's Chart, lab work & pertinent test results  Airway Mallampati: II  TM Distance: >3 FB Neck ROM: Full    Dental no notable dental hx.    Pulmonary neg pulmonary ROS, former smoker,    Pulmonary exam normal breath sounds clear to auscultation       Cardiovascular hypertension, negative cardio ROS Normal cardiovascular exam Rhythm:Regular Rate:Normal     Neuro/Psych negative neurological ROS  negative psych ROS   GI/Hepatic negative GI ROS, Neg liver ROS,   Endo/Other  negative endocrine ROS  Renal/GU negative Renal ROS     Musculoskeletal negative musculoskeletal ROS (+)   Abdominal   Peds  Hematology negative hematology ROS (+)   Anesthesia Other Findings   Reproductive/Obstetrics negative OB ROS                            Anesthesia Physical Anesthesia Plan  ASA: III  Anesthesia Plan: Regional and Spinal   Post-op Pain Management:    Induction:   Airway Management Planned:   Additional Equipment:   Intra-op Plan:   Post-operative Plan:   Informed Consent: I have reviewed the patients History and Physical, chart, labs and discussed the procedure including the risks, benefits and alternatives for the proposed anesthesia with the patient or authorized representative who has indicated his/her understanding and acceptance.   Dental advisory given  Plan Discussed with: CRNA  Anesthesia Plan Comments:         Anesthesia Quick Evaluation

## 2016-05-01 DIAGNOSIS — R51 Headache: Secondary | ICD-10-CM | POA: Diagnosis not present

## 2016-05-01 DIAGNOSIS — I161 Hypertensive emergency: Secondary | ICD-10-CM | POA: Diagnosis not present

## 2016-05-01 DIAGNOSIS — I160 Hypertensive urgency: Secondary | ICD-10-CM | POA: Diagnosis not present

## 2016-05-01 NOTE — Discharge Summary (Signed)
Physician Discharge Summary  Heather SoursBernice Mottley [id number] DOB: [birth year] DOA: 04/30/2016  PCP: Patria ManeGASSEMI, MIKE, MD  Admit date: 04/30/2016 Discharge date: 05/01/2016   Recommendations for Outpatient Follow-Up:   1. Patient accidentally stopped her BP Medications before her knee replacement   Discharge Diagnosis:   Principal Problem:   Hypertensive emergency Active Problems:   Primary localized osteoarthrosis of the knee, left   Radicular syndrome of left leg   Osteoarthritis of lumbar spine   Vitamin D deficiency   GERD (gastroesophageal reflux disease)   Depression   Headache   CKD (chronic kidney disease)   Acute nonintractable headache   Discharge disposition:  Home  Discharge Condition: Improved.  Diet recommendation: Low sodium, heart healthy  Wound care: None.   History of Present Illness:   Heather Holt is a 83 y.o. female who presents for admission from short stay after canceled orthopedic procedure for hypertensive emergency. Pt accidentally stopped all her medications several days ago which resulted in marked symptomatic blood pressure elevation. HA resolved when BP returned to normal range w/ no focal deficits at any time.    Hospital Course by Problem:   Hypertensive emergency. Patient has not taken any of her antihypertensive meds for 5 days. She misunderstood post procedure instructions last week when she had epidural injection in her lumbar spine. Home medications include, benazepril, metoprolol, triamterene and hydrochlorothiazide -continue her home medications -symptoms resolved   Chronic kidney disease stage II. Creatinine 1.3 on admission.    Osteoarthritis of the lumbar spine. Table at baseline. She had an epidural injection last week.  GERD. Stable at baseline -Continue home meds   Primary localized osteoarthritis of the left knee. knee replacement  canceled secondary to high blood pressure -Supportive therapy - orthopedics to  reschedule    Medical Consultants:    None.   Discharge Exam:   Vitals:   05/01/16 0742 05/01/16 1000  BP: (!) 153/76 (!) 153/76  Pulse: 68 84  Resp: 18   Temp: 98.7 F (37.1 C)    Vitals:   05/01/16 0025 05/01/16 0627 05/01/16 0742 05/01/16 1000  BP: (!) 157/75 (!) 150/76 (!) 153/76 (!) 153/76  Pulse: 69 73 68 84  Resp: 18 18 18    Temp: 98.7 F (37.1 C) 98.1 F (36.7 C) 98.7 F (37.1 C)   TempSrc: Oral Oral Oral   SpO2: 97% 96% 95%   Weight:  89.4 kg (197 lb 3.2 oz)    Height:  5\' 9"  (1.753 m)      Gen:  NAD    The results of significant diagnostics from this hospitalization (including imaging, microbiology, ancillary and laboratory) are listed below for reference.     Procedures and Diagnostic Studies:   No results found.   Labs:   Basic Metabolic Panel:  Recent Labs Lab 04/30/16 0857  NA 142  K 3.8  CL 107  GLUCOSE 101*  BUN 15  CREATININE 1.30*   GFR Estimated Creatinine Clearance: 39.1 mL/min (by C-G formula based on SCr of 1.3 mg/dL (H)). Liver Function Tests: No results for input(s): AST, ALT, ALKPHOS, BILITOT, PROT, ALBUMIN in the last 168 hours. No results for input(s): LIPASE, AMYLASE in the last 168 hours. No results for input(s): AMMONIA in the last 168 hours. Coagulation profile No results for input(s): INR, PROTIME in the last 168 hours.  CBC:  Recent Labs Lab 04/30/16 0857 04/30/16 1155  WBC  --  5.9  HGB 11.9* 11.1*  HCT 35.0* 35.5*  MCV  --  86.2  PLT  --  207   Cardiac Enzymes: No results for input(s): CKTOTAL, CKMB, CKMBINDEX, TROPONINI in the last 168 hours. BNP: Invalid input(s): POCBNP CBG: No results for input(s): GLUCAP in the last 168 hours. D-Dimer No results for input(s): DDIMER in the last 72 hours. Hgb A1c No results for input(s): HGBA1C in the last 72 hours. Lipid Profile No results for input(s): CHOL, HDL, LDLCALC, TRIG, CHOLHDL, LDLDIRECT in the last 72 hours. Thyroid function studies No  results for input(s): TSH, T4TOTAL, T3FREE, THYROIDAB in the last 72 hours.  Invalid input(s): FREET3 Anemia work up No results for input(s): VITAMINB12, FOLATE, FERRITIN, TIBC, IRON, RETICCTPCT in the last 72 hours. Microbiology No results found for this or any previous visit (from the past 240 hour(s)).   Discharge Instructions:   Discharge Instructions    Diet - low sodium heart healthy    Complete by:  As directed    Increase activity slowly    Complete by:  As directed      Allergies as of 05/01/2016      Reactions   Amlodipine Swelling   SWELLING REACTION, EDEMA UNSPECIFIED      Medication List    STOP taking these medications   meloxicam 7.5 MG tablet Commonly known as:  MOBIC     TAKE these medications   benazepril 40 MG tablet Commonly known as:  LOTENSIN Take 40 mg by mouth daily.   cholecalciferol 1000 units tablet Commonly known as:  VITAMIN D Take 1,000 Units by mouth daily.   metoprolol succinate 100 MG 24 hr tablet Commonly known as:  TOPROL-XL Take 100 mg by mouth daily.   pantoprazole 40 MG tablet Commonly known as:  PROTONIX Take 40 mg by mouth daily.   Potassium 99 MG Tabs Take 198 mg by mouth daily.   terazosin 2 MG capsule Commonly known as:  HYTRIN Take 2 mg by mouth at bedtime.   triamterene-hydrochlorothiazide 37.5-25 MG capsule Commonly known as:  DYAZIDE Take 1 capsule by mouth daily.         Time coordinating discharge: 35 min  Signed:  Jahson Emanuele U Xenia Nile   [facility] 05/01/2016, 10:13 AM

## 2016-05-01 NOTE — Care Management Obs Status (Signed)
MEDICARE OBSERVATION STATUS NOTIFICATION   Patient Details  Name: Heather Holt MRN: [id number] Date of Birth: 12-29-1930   Medicare Observation Status Notification Given:  Yes    Gala LewandowskyGraves-Bigelow, Tamario Heal Kaye, RN 05/01/2016, 11:07 AM

## 2016-05-01 NOTE — Plan of Care (Signed)
Problem: Pain Managment: Goal: General experience of comfort will improve Outcome: Progressing Patient states discomfort in the knee that she is awaiting surgery on, especially [facility]

## 2016-05-01 NOTE — Progress Notes (Signed)
Patient being discharged home per MD order, All discharge instructions given to patient and family member, Discharge instruction given to patient by Liz BeachGabe, RN.

## 2016-05-01 NOTE — Care Management Note (Signed)
Case Management Note  Patient Details  Name: Heather Holt MRN: [id number] Date of Birth: April 02, 1930  Subjective/Objective:   Pt presented for hypertensive urgency. Pt is from home. PTA independent and pt still drives.                  Action/Plan: No needs from CM at this time.   Expected Discharge Date:  05/01/16               Expected Discharge Plan:  Home/Self Care  In-House Referral:  NA  Discharge planning Services  NA  Post Acute Care Choice:  NA Choice offered to:  NA  DME Arranged:  N/A DME Agency:  NA  HH Arranged:  NA HH Agency:  NA  Status of Service:  Completed, signed off  If discussed at Long Length of Stay Meetings, dates discussed:    Additional Comments:  Gala LewandowskyGraves-Bigelow, Ramy Greth Kaye, RN 05/01/2016, 11:08 AM

## 2016-05-07 ENCOUNTER — Inpatient Hospital Stay: Admit: 2016-05-07 | Payer: Medicare Other | Admitting: Orthopedic Surgery

## 2016-05-07 SURGERY — ARTHROPLASTY, KNEE, TOTAL
Anesthesia: Spinal | Laterality: Left

## 2016-07-12 ENCOUNTER — Other Ambulatory Visit: Payer: Self-pay | Admitting: Physician Assistant

## 2016-07-12 ENCOUNTER — Inpatient Hospital Stay (HOSPITAL_COMMUNITY): Admission: RE | Admit: 2016-07-12 | Payer: Medicare Other | Source: Ambulatory Visit

## 2016-07-19 ENCOUNTER — Encounter (HOSPITAL_COMMUNITY)
Admission: RE | Admit: 2016-07-19 | Discharge: 2016-07-19 | Disposition: A | Discharge status: Home / Self Care | Payer: Medicare Other | Source: Ambulatory Visit | Attending: Orthopedic Surgery | Admitting: Orthopedic Surgery

## 2016-07-19 ENCOUNTER — Encounter (HOSPITAL_COMMUNITY): Payer: Self-pay | Admitting: *Deleted

## 2016-07-19 DIAGNOSIS — Z01812 Encounter for preprocedural laboratory examination: Secondary | ICD-10-CM | POA: Insufficient documentation

## 2016-07-19 DIAGNOSIS — Z01818 Encounter for other preprocedural examination: Secondary | ICD-10-CM | POA: Insufficient documentation

## 2016-07-19 DIAGNOSIS — I129 Hypertensive chronic kidney disease with stage 1 through stage 4 chronic kidney disease, or unspecified chronic kidney disease: Secondary | ICD-10-CM | POA: Diagnosis not present

## 2016-07-19 DIAGNOSIS — N189 Chronic kidney disease, unspecified: Secondary | ICD-10-CM | POA: Insufficient documentation

## 2016-07-19 DIAGNOSIS — Z87891 Personal history of nicotine dependence: Secondary | ICD-10-CM | POA: Diagnosis not present

## 2016-07-19 DIAGNOSIS — Z0183 Encounter for blood typing: Secondary | ICD-10-CM | POA: Insufficient documentation

## 2016-07-19 DIAGNOSIS — K219 Gastro-esophageal reflux disease without esophagitis: Secondary | ICD-10-CM | POA: Diagnosis not present

## 2016-07-19 LAB — CBC WITH DIFFERENTIAL/PLATELET
BASOS ABS: 0 10*3/uL (ref 0.0–0.1)
BASOS PCT: 1 %
Eosinophils Absolute: 0.1 10*3/uL (ref 0.0–0.7)
Eosinophils Relative: 2 %
HCT: 33.7 % — ABNORMAL LOW (ref 36.0–46.0)
HEMOGLOBIN: 10.4 g/dL — AB (ref 12.0–15.0)
LYMPHS PCT: 35 %
Lymphs Abs: 1.8 10*3/uL (ref 0.7–4.0)
MCH: 26.5 pg (ref 26.0–34.0)
MCHC: 30.9 g/dL (ref 30.0–36.0)
MCV: 86 fL (ref 78.0–100.0)
MONO ABS: 0.4 10*3/uL (ref 0.1–1.0)
MONOS PCT: 7 %
NEUTROS PCT: 55 %
Neutro Abs: 2.8 10*3/uL (ref 1.7–7.7)
Platelets: 221 10*3/uL (ref 150–400)
RBC: 3.92 MIL/uL (ref 3.87–5.11)
RDW: 14.2 % (ref 11.5–15.5)
WBC: 5 10*3/uL (ref 4.0–10.5)

## 2016-07-19 LAB — COMPREHENSIVE METABOLIC PANEL
ALK PHOS: 49 U/L (ref 38–126)
ALT: 11 U/L — ABNORMAL LOW (ref 14–54)
AST: 17 U/L (ref 15–41)
Albumin: 3.9 g/dL (ref 3.5–5.0)
Anion gap: 8 (ref 5–15)
BILIRUBIN TOTAL: 0.5 mg/dL (ref 0.3–1.2)
BUN: 19 mg/dL (ref 6–20)
CALCIUM: 9.1 mg/dL (ref 8.9–10.3)
CO2: 21 mmol/L — ABNORMAL LOW (ref 22–32)
CREATININE: 1.53 mg/dL — AB (ref 0.44–1.00)
Chloride: 108 mmol/L (ref 101–111)
GFR calc Af Amer: 35 mL/min — ABNORMAL LOW (ref >60)
GFR, EST NON AFRICAN AMERICAN: 30 mL/min — AB (ref >60)
GLUCOSE: 107 mg/dL — AB (ref 65–99)
Potassium: 4.1 mmol/L (ref 3.5–5.1)
Sodium: 137 mmol/L (ref 135–145)
TOTAL PROTEIN: 7.2 g/dL (ref 6.5–8.1)

## 2016-07-19 LAB — TYPE AND SCREEN
ABO/RH(D): B POS
Antibody Screen: NEGATIVE

## 2016-07-19 LAB — SURGICAL PCR SCREEN
MRSA, PCR: NEGATIVE
Staphylococcus aureus: NEGATIVE

## 2016-07-19 LAB — PROTIME-INR
INR: 1.05
Prothrombin Time: 13.8 seconds (ref 11.4–15.2)

## 2016-07-19 NOTE — Progress Notes (Addendum)
PCP is Dr. Patria ManeMike Gassemi Cardiologist is Dr. Ladon ApplebaumHility saw 03-16-16 Stress test noted from 03-23-16 Surgery was cancelled due to high BP on the day of surgery. See Dr Bo MerinoAngela Kabbe's note from 04-20-16

## 2016-07-19 NOTE — Pre-Procedure Instructions (Signed)
Heather Holt  07/19/2016      [facility] - [place] - [address] [place] [address] Phone: [phone] Fax: [phone]    Your procedure is scheduled on June 11  Report to [facility] Admitting at 645 A.M.  Call this number if you have problems the morning of surgery:  [phone]   Remember:  Do not eat food or drink liquids after midnight.  Take these medicines the morning of surgery with A SIP OF WATER Metoprolol succinate (Toprol-XL), Pantoprazole (Protonix)  Stop taking Asprin, BC's, Goody's, Herbal medications, Ibuprofen, Advil, Motrin, Vitamins, Aleve   Do not wear jewelry, make-up or nail polish.  Do not wear lotions, powders, or perfumes, or deoderant.  Do not shave 48 hours prior to surgery.  Men may shave face and neck.  Do not bring valuables to the hospital.  [facility] is not responsible for any belongings or valuables.  Contacts, dentures or bridgework may not be worn into surgery.  Leave your suitcase in the car.  After surgery it may be brought to your room.  For patients admitted to the hospital, discharge time will be determined by your treatment team.  Patients discharged the day of surgery will not be allowed to drive home.    Special instructions:  [facility] [facility] - Preparing for Surgery  Before surgery, you can play an important role.  Because skin is not sterile, your skin needs to be as free of germs as possible.  You can reduce the number of germs on you skin by washing with CHG (chlorahexidine gluconate) soap before surgery.  CHG is an antiseptic cleaner which kills germs and bonds with the skin to continue killing germs even after washing.  Please DO NOT use if you have an allergy to CHG or antibacterial soaps.  If your skin becomes reddened/irritated stop using the CHG and inform your nurse when you arrive at Short Stay.  Do not shave (including legs and underarms)  for at least 48 hours prior to the first CHG shower.  You may shave your face.  Please follow these instructions carefully:   1.  Shower with CHG Soap the night before surgery and the                                morning of Surgery.  2.  If you choose to wash your hair, wash your hair first as usual with your       normal shampoo.  3.  After you shampoo, rinse your hair and body thoroughly to remove the                      Shampoo.  4.  Use CHG as you would any other liquid soap.  You can apply chg directly       to the skin and wash gently with scrungie or a clean washcloth.  5.  Apply the CHG Soap to your body ONLY FROM THE NECK DOWN.        Do not use on open wounds or open sores.  Avoid contact with your eyes,       ears, mouth and genitals (private parts).  Wash genitals (private parts)       with your normal soap.  6.  Wash thoroughly, paying special attention to the area where your surgery  will be performed.  7.  Thoroughly rinse your body with warm water from the neck down.  8.  DO NOT shower/wash with your normal soap after using and rinsing off       the CHG Soap.  9.  Pat yourself dry with a clean towel.            10.  Wear clean pajamas.            11.  Place clean sheets on your bed the night of your first shower and do not        sleep with pets.  Day of Surgery  Do not apply any lotions/deoderants the morning of surgery.  Please wear clean clothes to the hospital/surgery center.     Please read over the following fact sheets that you were given. Pain Booklet, Coughing and Deep Breathing, MRSA Information and Surgical Site Infection Prevention, Incentive Spirometry

## 2016-07-20 ENCOUNTER — Encounter (HOSPITAL_COMMUNITY): Payer: Self-pay | Admitting: Emergency Medicine

## 2016-07-20 LAB — URINE CULTURE

## 2016-07-20 NOTE — Progress Notes (Signed)
Anesthesia Chart Review:  Pt is an 83 year old female scheduled for L total knee arthroplasty on 07/30/2016 with Salvatore Marvelobert Wainer, MD.   Surgery was originally scheduled for 04/30/16 but was postponed when patient had elevated BP DOS (she had misunderstood pre-op instructions and stopped all BP meds 5 days before surgery)  - PCP is Patria ManeMike Gassemi, MD, who cleared pt for surgery at last office visit 06/28/16 (notes in care everywhere) - Pt saw Zoila ShutterKenneth Hilty, MD with cardiology 03/16/16 for pre-op eval. Stress test ordered, results below.   PMH includes:  HTN, CKD, GERD. Former smoker. BMI 29.5  Medications include: benazepril, metoprolol, protonix, potassium, terazosin, dyazide  BP (!) 161/72   Pulse 63   Temp 36.8 C   Resp 20   Ht 5\' 9"  (1.753 m)   Wt 199 lb 4.8 oz (90.4 kg)   SpO2 98%   BMI 29.43 kg/m    Preoperative labs reviewed.  Cr 1.53, BUN 18. This is consistent with prior results   EKG 03/16/16: sinus bradycardia (59 bpm). T wave abnormality, consider lateral ischemia.   Nuclear stress test 03/23/16:   Nuclear stress EF: 56%.  The left ventricular ejection fraction is normal (55-65%).  There was accentuation of the T wave abnormality in the inferolateral leads during infusion  There is a small defect present in the apical anterior and apical septal location. The defect is partially reversible. This is likely due to variations in breast attenuation artifact.  This is a low risk study.  If no changes, I anticipate pt can proceed with surgery as scheduled.   Rica Mastngela Meyli Boice, FNP-BC [facility]iology Phone: [phone] 07/20/2016 12:49 PM

## 2016-07-23 ENCOUNTER — Inpatient Hospital Stay: Admit: 2016-07-23 | Payer: Medicare Other | Admitting: Orthopedic Surgery

## 2016-07-23 SURGERY — ARTHROPLASTY, KNEE, TOTAL
Anesthesia: Spinal | Laterality: Left

## 2016-07-30 ENCOUNTER — Encounter (HOSPITAL_COMMUNITY): Admission: RE | Payer: Self-pay | Source: Ambulatory Visit

## 2016-07-30 ENCOUNTER — Inpatient Hospital Stay (HOSPITAL_COMMUNITY): Admission: RE | Admit: 2016-07-30 | Payer: Medicare Other | Source: Ambulatory Visit | Admitting: Orthopedic Surgery

## 2016-07-30 SURGERY — ARTHROPLASTY, KNEE, TOTAL
Anesthesia: Spinal | Laterality: Left

## 2019-01-06 IMAGING — NM NM MISC PROCEDURE
6 series · 36 of 36 positions shown · non-contrast
Comparison: none

[Series 1: wbr rest · 6.40mm/px · 6 of 64 frames shown]
[frame 6/64]
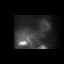
[frame 16/64]
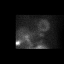
[frame 27/64]
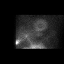
[frame 38/64]
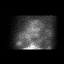
[frame 48/64]
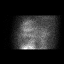
[frame 59/64]
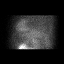

[Series 1: wbr_r-proj_st wbr rest · 6.40mm/px · 6 of 64 frames shown]
[frame 6/64]
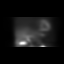
[frame 16/64]
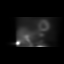
[frame 27/64]
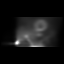
[frame 38/64]
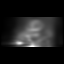
[frame 48/64]
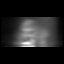
[frame 59/64]
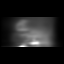

[Series 2: wbr stress-gsp · 6.40mm/px · 6 of 512 frames shown]
[frame 43/512]
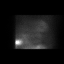
[frame 128/512]
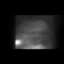
[frame 214/512]
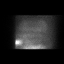
[frame 299/512]
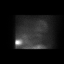
[frame 384/512]
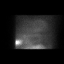
[frame 470/512]
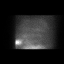

[Series 2: wbr_s-proj_st wbr stress-gsp · 6.40mm/px · 6 of 512 frames shown]
[frame 43/512]
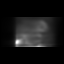
[frame 128/512]
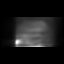
[frame 214/512]
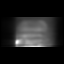
[frame 299/512]
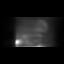
[frame 384/512]
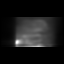
[frame 470/512]
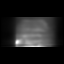

[Series 3: wbr stress-sum-em · 6.40mm/px · 6 of 64 frames shown]
[frame 6/64]
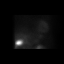
[frame 16/64]
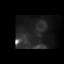
[frame 27/64]
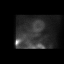
[frame 38/64]
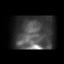
[frame 48/64]
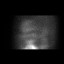
[frame 59/64]
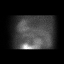

[Series 3: wbr_s-proj_st wbr stress-sum-em · 6.40mm/px · 6 of 64 frames shown]
[frame 6/64]
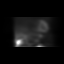
[frame 16/64]
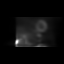
[frame 27/64]
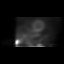
[frame 38/64]
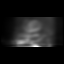
[frame 48/64]
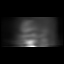
[frame 59/64]
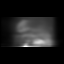

[36 of 36 positions shown; findings below may reference images not displayed]

Canned report from images found in remote index.

Refer to host system for actual result text.

## 2023-11-29 NOTE — Telephone Encounter (Signed)
 This request was faxed to [employer] [facility] [phone].

## 2023-12-01 ENCOUNTER — Encounter (HOSPITAL_COMMUNITY): Payer: Self-pay | Admitting: Neurology

## 2023-12-01 ENCOUNTER — Inpatient Hospital Stay (HOSPITAL_COMMUNITY): Admitting: Anesthesiology

## 2023-12-01 ENCOUNTER — Inpatient Hospital Stay (HOSPITAL_COMMUNITY)
Admission: EM | Admit: 2023-12-01 | Discharge: 2023-12-09 | DRG: 023 | Disposition: A | Discharge status: Hospice | Attending: Neurology | Admitting: Neurology

## 2023-12-01 ENCOUNTER — Inpatient Hospital Stay (HOSPITAL_COMMUNITY)

## 2023-12-01 ENCOUNTER — Emergency Department (HOSPITAL_COMMUNITY)

## 2023-12-01 ENCOUNTER — Encounter (HOSPITAL_COMMUNITY)
Admission: EM | Disposition: A | Discharge status: Hospice | Payer: Self-pay | Source: Home / Self Care | Attending: Neurology

## 2023-12-01 DIAGNOSIS — I129 Hypertensive chronic kidney disease with stage 1 through stage 4 chronic kidney disease, or unspecified chronic kidney disease: Secondary | ICD-10-CM | POA: Diagnosis present

## 2023-12-01 DIAGNOSIS — F32A Depression, unspecified: Secondary | ICD-10-CM | POA: Diagnosis present

## 2023-12-01 DIAGNOSIS — J9602 Acute respiratory failure with hypercapnia: Secondary | ICD-10-CM | POA: Diagnosis present

## 2023-12-01 DIAGNOSIS — G936 Cerebral edema: Secondary | ICD-10-CM | POA: Diagnosis present

## 2023-12-01 DIAGNOSIS — G8194 Hemiplegia, unspecified affecting left nondominant side: Secondary | ICD-10-CM | POA: Diagnosis present

## 2023-12-01 DIAGNOSIS — G935 Compression of brain: Secondary | ICD-10-CM | POA: Diagnosis present

## 2023-12-01 DIAGNOSIS — I4891 Unspecified atrial fibrillation: Secondary | ICD-10-CM | POA: Diagnosis not present

## 2023-12-01 DIAGNOSIS — Z66 Do not resuscitate: Secondary | ICD-10-CM | POA: Diagnosis not present

## 2023-12-01 DIAGNOSIS — M1712 Unilateral primary osteoarthritis, left knee: Secondary | ICD-10-CM | POA: Diagnosis present

## 2023-12-01 DIAGNOSIS — Z809 Family history of malignant neoplasm, unspecified: Secondary | ICD-10-CM

## 2023-12-01 DIAGNOSIS — I4819 Other persistent atrial fibrillation: Secondary | ICD-10-CM | POA: Diagnosis present

## 2023-12-01 DIAGNOSIS — I639 Cerebral infarction, unspecified: Secondary | ICD-10-CM | POA: Diagnosis not present

## 2023-12-01 DIAGNOSIS — Z79899 Other long term (current) drug therapy: Secondary | ICD-10-CM

## 2023-12-01 DIAGNOSIS — Z87891 Personal history of nicotine dependence: Secondary | ICD-10-CM

## 2023-12-01 DIAGNOSIS — M6281 Muscle weakness (generalized): Secondary | ICD-10-CM

## 2023-12-01 DIAGNOSIS — R471 Dysarthria and anarthria: Secondary | ICD-10-CM | POA: Diagnosis present

## 2023-12-01 DIAGNOSIS — I10 Essential (primary) hypertension: Secondary | ICD-10-CM | POA: Diagnosis not present

## 2023-12-01 DIAGNOSIS — E876 Hypokalemia: Secondary | ICD-10-CM | POA: Diagnosis not present

## 2023-12-01 DIAGNOSIS — R911 Solitary pulmonary nodule: Secondary | ICD-10-CM | POA: Diagnosis present

## 2023-12-01 DIAGNOSIS — M47816 Spondylosis without myelopathy or radiculopathy, lumbar region: Secondary | ICD-10-CM | POA: Diagnosis present

## 2023-12-01 DIAGNOSIS — R627 Adult failure to thrive: Secondary | ICD-10-CM | POA: Diagnosis not present

## 2023-12-01 DIAGNOSIS — Z87442 Personal history of urinary calculi: Secondary | ICD-10-CM | POA: Diagnosis not present

## 2023-12-01 DIAGNOSIS — N183 Chronic kidney disease, stage 3 unspecified: Secondary | ICD-10-CM | POA: Diagnosis not present

## 2023-12-01 DIAGNOSIS — Z833 Family history of diabetes mellitus: Secondary | ICD-10-CM

## 2023-12-01 DIAGNOSIS — R29810 Facial weakness: Secondary | ICD-10-CM | POA: Diagnosis present

## 2023-12-01 DIAGNOSIS — I63411 Cerebral infarction due to embolism of right middle cerebral artery: Principal | ICD-10-CM | POA: Diagnosis present

## 2023-12-01 DIAGNOSIS — I6601 Occlusion and stenosis of right middle cerebral artery: Secondary | ICD-10-CM

## 2023-12-01 DIAGNOSIS — N179 Acute kidney failure, unspecified: Secondary | ICD-10-CM | POA: Diagnosis not present

## 2023-12-01 DIAGNOSIS — R400 Somnolence: Secondary | ICD-10-CM | POA: Diagnosis not present

## 2023-12-01 DIAGNOSIS — E559 Vitamin D deficiency, unspecified: Secondary | ICD-10-CM | POA: Diagnosis present

## 2023-12-01 DIAGNOSIS — R29725 NIHSS score 25: Secondary | ICD-10-CM | POA: Diagnosis present

## 2023-12-01 DIAGNOSIS — N1831 Chronic kidney disease, stage 3a: Secondary | ICD-10-CM | POA: Diagnosis present

## 2023-12-01 DIAGNOSIS — I998 Other disorder of circulatory system: Secondary | ICD-10-CM | POA: Diagnosis not present

## 2023-12-01 DIAGNOSIS — I69391 Dysphagia following cerebral infarction: Secondary | ICD-10-CM | POA: Diagnosis not present

## 2023-12-01 DIAGNOSIS — R1310 Dysphagia, unspecified: Secondary | ICD-10-CM | POA: Diagnosis present

## 2023-12-01 DIAGNOSIS — Z7189 Other specified counseling: Secondary | ICD-10-CM | POA: Diagnosis not present

## 2023-12-01 DIAGNOSIS — Z888 Allergy status to other drugs, medicaments and biological substances status: Secondary | ICD-10-CM

## 2023-12-01 DIAGNOSIS — Z515 Encounter for palliative care: Secondary | ICD-10-CM

## 2023-12-01 DIAGNOSIS — Z9911 Dependence on respirator [ventilator] status: Secondary | ICD-10-CM

## 2023-12-01 DIAGNOSIS — E785 Hyperlipidemia, unspecified: Secondary | ICD-10-CM | POA: Diagnosis present

## 2023-12-01 DIAGNOSIS — I63511 Cerebral infarction due to unspecified occlusion or stenosis of right middle cerebral artery: Secondary | ICD-10-CM | POA: Diagnosis present

## 2023-12-01 DIAGNOSIS — I482 Chronic atrial fibrillation, unspecified: Secondary | ICD-10-CM

## 2023-12-01 DIAGNOSIS — I6389 Other cerebral infarction: Secondary | ICD-10-CM | POA: Diagnosis not present

## 2023-12-01 DIAGNOSIS — K219 Gastro-esophageal reflux disease without esophagitis: Secondary | ICD-10-CM | POA: Diagnosis present

## 2023-12-01 DIAGNOSIS — I480 Paroxysmal atrial fibrillation: Secondary | ICD-10-CM

## 2023-12-01 DIAGNOSIS — R233 Spontaneous ecchymoses: Secondary | ICD-10-CM | POA: Diagnosis not present

## 2023-12-01 DIAGNOSIS — I63131 Cerebral infarction due to embolism of right carotid artery: Secondary | ICD-10-CM

## 2023-12-01 HISTORY — PX: RADIOLOGY WITH ANESTHESIA: SHX6223

## 2023-12-01 HISTORY — PX: IR PERCUTANEOUS ART THROMBECTOMY/INFUSION INTRACRANIAL INC DIAG ANGIO: IMG6087

## 2023-12-01 LAB — CBC
HCT: 39.5 % (ref 36.0–46.0)
Hemoglobin: 12.3 g/dL (ref 12.0–15.0)
MCH: 27.7 pg (ref 26.0–34.0)
MCHC: 31.1 g/dL (ref 30.0–36.0)
MCV: 89 fL (ref 80.0–100.0)
Platelets: 158 K/uL (ref 150–400)
RBC: 4.44 MIL/uL (ref 3.87–5.11)
RDW: 14.4 % (ref 11.5–15.5)
WBC: 4.5 K/uL (ref 4.0–10.5)
nRBC: 0 % (ref 0.0–0.2)

## 2023-12-01 LAB — COMPREHENSIVE METABOLIC PANEL WITH GFR
ALT: 16 U/L (ref 0–44)
AST: 22 U/L (ref 15–41)
Albumin: 3.8 g/dL (ref 3.5–5.0)
Alkaline Phosphatase: 61 U/L (ref 38–126)
Anion gap: 12 (ref 5–15)
BUN: 16 mg/dL (ref 8–23)
CO2: 24 mmol/L (ref 22–32)
Calcium: 9.4 mg/dL (ref 8.9–10.3)
Chloride: 105 mmol/L (ref 98–111)
Creatinine, Ser: 1.35 mg/dL — ABNORMAL HIGH (ref 0.44–1.00)
GFR, Estimated: 37 mL/min — ABNORMAL LOW (ref >60)
Glucose, Bld: 149 mg/dL — ABNORMAL HIGH (ref 70–99)
Potassium: 3.4 mmol/L — ABNORMAL LOW (ref 3.5–5.1)
Sodium: 141 mmol/L (ref 135–145)
Total Bilirubin: 1.6 mg/dL — ABNORMAL HIGH (ref 0.0–1.2)
Total Protein: 7.2 g/dL (ref 6.5–8.1)

## 2023-12-01 LAB — DIFFERENTIAL
Abs Immature Granulocytes: 0.01 K/uL (ref 0.00–0.07)
Basophils Absolute: 0 K/uL (ref 0.0–0.1)
Basophils Relative: 0 %
Eosinophils Absolute: 0 K/uL (ref 0.0–0.5)
Eosinophils Relative: 0 %
Immature Granulocytes: 0 %
Lymphocytes Relative: 13 %
Lymphs Abs: 0.6 K/uL — ABNORMAL LOW (ref 0.7–4.0)
Monocytes Absolute: 0.2 K/uL (ref 0.1–1.0)
Monocytes Relative: 5 %
Neutro Abs: 3.6 K/uL (ref 1.7–7.7)
Neutrophils Relative %: 82 %

## 2023-12-01 LAB — I-STAT CHEM 8, ED
BUN: 17 mg/dL (ref 8–23)
Calcium, Ion: 1.17 mmol/L (ref 1.15–1.40)
Chloride: 105 mmol/L (ref 98–111)
Creatinine, Ser: 1.4 mg/dL — ABNORMAL HIGH (ref 0.44–1.00)
Glucose, Bld: 146 mg/dL — ABNORMAL HIGH (ref 70–99)
HCT: 40 % (ref 36.0–46.0)
Hemoglobin: 13.6 g/dL (ref 12.0–15.0)
Potassium: 3.4 mmol/L — ABNORMAL LOW (ref 3.5–5.1)
Sodium: 144 mmol/L (ref 135–145)
TCO2: 25 mmol/L (ref 22–32)

## 2023-12-01 LAB — POCT I-STAT 7, (LYTES, BLD GAS, ICA,H+H)
Acid-base deficit: 1 mmol/L (ref 0.0–2.0)
Bicarbonate: 23.5 mmol/L (ref 20.0–28.0)
Calcium, Ion: 1.22 mmol/L (ref 1.15–1.40)
HCT: 39 % (ref 36.0–46.0)
Hemoglobin: 13.3 g/dL (ref 12.0–15.0)
O2 Saturation: 100 %
Potassium: 3.1 mmol/L — ABNORMAL LOW (ref 3.5–5.1)
Sodium: 143 mmol/L (ref 135–145)
TCO2: 25 mmol/L (ref 22–32)
pCO2 arterial: 36.1 mmHg (ref 32–48)
pH, Arterial: 7.421 (ref 7.35–7.45)
pO2, Arterial: 436 mmHg — ABNORMAL HIGH (ref 83–108)

## 2023-12-01 LAB — MRSA NEXT GEN BY PCR, NASAL: MRSA by PCR Next Gen: NOT DETECTED

## 2023-12-01 LAB — CBG MONITORING, ED: Glucose-Capillary: 131 mg/dL — ABNORMAL HIGH (ref 70–99)

## 2023-12-01 LAB — APTT: aPTT: 29 s (ref 24–36)

## 2023-12-01 LAB — PROTIME-INR
INR: 1.1 (ref 0.8–1.2)
Prothrombin Time: 14.9 s (ref 11.4–15.2)

## 2023-12-01 LAB — ETHANOL: Alcohol, Ethyl (B): <15 mg/dL (ref <15)

## 2023-12-01 SURGERY — RADIOLOGY WITH ANESTHESIA
Anesthesia: General

## 2023-12-01 MED ORDER — SODIUM CHLORIDE 0.9 % IV SOLN: INTRAVENOUS | Status: DC | PRN

## 2023-12-01 MED ORDER — IOHEXOL 350 MG/ML SOLN
100.0000 mL | Freq: Once | INTRAVENOUS | Status: AC | PRN
  Administered 2023-12-01: 100 mL via INTRAVENOUS

## 2023-12-01 MED ORDER — ONDANSETRON HCL 4 MG/2ML IJ SOLN
INTRAMUSCULAR | Status: DC | PRN
Start: 2023-12-01 — End: 2023-12-01
  Administered 2023-12-01: 4 mg via INTRAVENOUS

## 2023-12-01 MED ORDER — SUCCINYLCHOLINE CHLORIDE 200 MG/10ML IV SOSY
PREFILLED_SYRINGE | INTRAVENOUS | Status: DC | PRN
Start: 2023-12-01 — End: 2023-12-01
  Administered 2023-12-01: 120 mg via INTRAVENOUS

## 2023-12-01 MED ORDER — METOPROLOL TARTRATE 5 MG/5ML IV SOLN
INTRAVENOUS | Status: AC
  Filled 2023-12-01: qty 5

## 2023-12-01 MED ORDER — ESMOLOL HCL 100 MG/10ML IV SOLN
INTRAVENOUS | Status: DC | PRN
Start: 2023-12-01 — End: 2023-12-01
  Administered 2023-12-01: 20 mg via INTRAVENOUS
  Administered 2023-12-01: 50 mg via INTRAVENOUS
  Administered 2023-12-01: 30 mg via INTRAVENOUS

## 2023-12-01 MED ORDER — PROPOFOL 1000 MG/100ML IV EMUL
INTRAVENOUS | Status: AC
  Administered 2023-12-01: 5 ug/kg/min via INTRAVENOUS
  Filled 2023-12-01: qty 100

## 2023-12-01 MED ORDER — FENTANYL CITRATE (PF) 50 MCG/ML IJ SOSY: 50.0000 ug | PREFILLED_SYRINGE | INTRAMUSCULAR | Status: DC | PRN

## 2023-12-01 MED ORDER — FENTANYL CITRATE (PF) 250 MCG/5ML IJ SOLN: INTRAMUSCULAR | Status: DC | PRN

## 2023-12-01 MED ORDER — METOPROLOL TARTRATE 5 MG/5ML IV SOLN
INTRAVENOUS | Status: DC | PRN
  Administered 2023-12-01: 2.5 mg via INTRAVENOUS

## 2023-12-01 MED ORDER — ROCURONIUM BROMIDE 10 MG/ML (PF) SYRINGE
PREFILLED_SYRINGE | INTRAVENOUS | Status: DC | PRN
  Administered 2023-12-01: 40 mg via INTRAVENOUS
  Administered 2023-12-01: 10 mg via INTRAVENOUS

## 2023-12-01 MED ORDER — FENTANYL CITRATE (PF) 100 MCG/2ML IJ SOLN
INTRAMUSCULAR | Status: AC
  Filled 2023-12-01: qty 2

## 2023-12-01 MED ORDER — ORAL CARE MOUTH RINSE
15.0000 mL | OROMUCOSAL | Status: DC
  Administered 2023-12-01 – 2023-12-04 (×37): 15 mL via OROMUCOSAL

## 2023-12-01 MED ORDER — FENTANYL CITRATE (PF) 100 MCG/2ML IJ SOLN
INTRAMUSCULAR | Status: DC | PRN
  Administered 2023-12-01: 50 ug via INTRAVENOUS

## 2023-12-01 MED ORDER — ACETAMINOPHEN 325 MG PO TABS
650.0000 mg | ORAL_TABLET | ORAL | Status: DC | PRN
  Administered 2023-12-06: 650 mg via ORAL
  Filled 2023-12-01: qty 2

## 2023-12-01 MED ORDER — ACETAMINOPHEN 650 MG RE SUPP
650.0000 mg | RECTAL | Status: DC | PRN
  Administered 2023-12-05: 650 mg via RECTAL
  Filled 2023-12-01: qty 1

## 2023-12-01 MED ORDER — LIDOCAINE 2% (20 MG/ML) 5 ML SYRINGE
INTRAMUSCULAR | Status: DC | PRN
  Administered 2023-12-01: 20 mg via INTRAVENOUS

## 2023-12-01 MED ORDER — PROPOFOL 10 MG/ML IV BOLUS
INTRAVENOUS | Status: DC | PRN
  Administered 2023-12-01: 50 ug/kg/min via INTRAVENOUS
  Administered 2023-12-01: 100 mg via INTRAVENOUS

## 2023-12-01 MED ORDER — IOHEXOL 300 MG/ML  SOLN: 100.0000 mL | Freq: Once | INTRAMUSCULAR | Status: DC | PRN

## 2023-12-01 MED ORDER — SODIUM CHLORIDE 0.9 % IV BOLUS: 250.0000 mL | INTRAVENOUS | Status: AC | PRN

## 2023-12-01 MED ORDER — PHENYLEPHRINE HCL-NACL 20-0.9 MG/250ML-% IV SOLN
INTRAVENOUS | Status: DC | PRN
  Administered 2023-12-01: 50 ug/min via INTRAVENOUS

## 2023-12-01 MED ORDER — DEXAMETHASONE SOD PHOSPHATE PF 10 MG/ML IJ SOLN
INTRAMUSCULAR | Status: DC | PRN
  Administered 2023-12-01: 5 mg via INTRAVENOUS

## 2023-12-01 MED ORDER — DOCUSATE SODIUM 50 MG/5ML PO LIQD
100.0000 mg | Freq: Two times a day (BID) | ORAL | Status: DC
  Administered 2023-12-02 – 2023-12-03 (×2): 100 mg
  Filled 2023-12-01 (×3): qty 10

## 2023-12-01 MED ORDER — ONDANSETRON HCL 4 MG/2ML IJ SOLN
INTRAMUSCULAR | Status: AC
  Filled 2023-12-01: qty 2

## 2023-12-01 MED ORDER — STROKE: EARLY STAGES OF RECOVERY BOOK
Freq: Once | Status: AC
  Filled 2023-12-01: qty 1

## 2023-12-01 MED ORDER — SODIUM CHLORIDE 0.9 % IV SOLN: INTRAVENOUS | Status: AC

## 2023-12-01 MED ORDER — PROPOFOL 1000 MG/100ML IV EMUL
0.0000 ug/kg/min | INTRAVENOUS | Status: DC
  Filled 2023-12-01: qty 100

## 2023-12-01 MED ORDER — ACETAMINOPHEN 160 MG/5ML PO SOLN: 650.0000 mg | ORAL | Status: DC | PRN

## 2023-12-01 MED ORDER — SODIUM CHLORIDE 0.9% FLUSH: 3.0000 mL | Freq: Once | INTRAVENOUS | Status: DC

## 2023-12-01 MED ORDER — CHLORHEXIDINE GLUCONATE CLOTH 2 % EX PADS
6.0000 | MEDICATED_PAD | Freq: Every day | CUTANEOUS | Status: DC
  Administered 2023-12-02 – 2023-12-06 (×5): 6 via TOPICAL

## 2023-12-01 MED ORDER — CLEVIDIPINE BUTYRATE 0.5 MG/ML IV EMUL
INTRAVENOUS | Status: AC
  Filled 2023-12-01: qty 50

## 2023-12-01 MED ORDER — ORAL CARE MOUTH RINSE: 15.0000 mL | OROMUCOSAL | Status: DC | PRN

## 2023-12-01 MED ORDER — CLEVIDIPINE BUTYRATE 0.5 MG/ML IV EMUL
INTRAVENOUS | Status: DC | PRN
  Administered 2023-12-01: 2 mg/h via INTRAVENOUS

## 2023-12-01 MED ORDER — FENTANYL CITRATE (PF) 50 MCG/ML IJ SOSY
50.0000 ug | PREFILLED_SYRINGE | INTRAMUSCULAR | Status: DC | PRN
  Administered 2023-12-01: 50 ug via INTRAVENOUS
  Administered 2023-12-02: 200 ug via INTRAVENOUS
  Administered 2023-12-02 (×2): 100 ug via INTRAVENOUS
  Administered 2023-12-02: 50 ug via INTRAVENOUS
  Administered 2023-12-02 – 2023-12-03 (×3): 100 ug via INTRAVENOUS
  Administered 2023-12-03: 150 ug via INTRAVENOUS
  Filled 2023-12-01: qty 2
  Filled 2023-12-01: qty 1
  Filled 2023-12-01: qty 2
  Filled 2023-12-01: qty 3
  Filled 2023-12-01: qty 2
  Filled 2023-12-01: qty 1
  Filled 2023-12-01: qty 2
  Filled 2023-12-01: qty 1
  Filled 2023-12-01 (×2): qty 2

## 2023-12-01 MED ORDER — SENNOSIDES-DOCUSATE SODIUM 8.6-50 MG PO TABS: 1.0000 | ORAL_TABLET | Freq: Every evening | ORAL | Status: DC | PRN

## 2023-12-01 MED ORDER — CLEVIDIPINE BUTYRATE 0.5 MG/ML IV EMUL
0.0000 mg/h | INTRAVENOUS | Status: DC
  Administered 2023-12-01 – 2023-12-02 (×2): 8 mg/h via INTRAVENOUS
  Administered 2023-12-02: 6 mg/h via INTRAVENOUS
  Administered 2023-12-02 – 2023-12-03 (×2): 4 mg/h via INTRAVENOUS
  Administered 2023-12-04: 2 mg/h via INTRAVENOUS
  Filled 2023-12-01 (×7): qty 50

## 2023-12-01 MED ORDER — POLYETHYLENE GLYCOL 3350 17 G PO PACK
17.0000 g | PACK | Freq: Every day | ORAL | Status: DC
  Administered 2023-12-03: 17 g
  Filled 2023-12-01 (×2): qty 1

## 2023-12-01 MED ORDER — IOHEXOL 300 MG/ML  SOLN
150.0000 mL | Freq: Once | INTRAMUSCULAR | Status: AC | PRN
  Administered 2023-12-01: 50 mL via INTRA_ARTERIAL

## 2023-12-01 MED ORDER — ONDANSETRON HCL 4 MG/2ML IJ SOLN
4.0000 mg | Freq: Four times a day (QID) | INTRAMUSCULAR | Status: DC | PRN
  Administered 2023-12-01: 4 mg via INTRAVENOUS

## 2023-12-01 NOTE — Brief Op Note (Signed)
  NEUROSURGERY BRIEF THROMBECTOMY NOTE   PREOP DX: RMCA Occlusion  POSTOP DX: Same  PROCEDURE: RMCA thrombectomy  SURGEON: Dino Sable   ANESTHESIA: GETA  EBL: Minimal  Number of Passes: 1  Technique: ASPIRATION WITH STENT RETRIEVER  Final TICI score: 2B  Post OP blood pressure goal: SBP<160  Arterial Angioplasty or Stent: No   Anti-Platelet Therapy: No   COMPLICATIONS: No   CONDITION: Stable to recovery  FINDINGS (Full report in [facility]): 1. RMCA clot. Small residual distal clot.   Heather Holt  @today @ 2:30 PM

## 2023-12-01 NOTE — Consult Note (Signed)
   NAME:  Heather Holt, MRN:  [id number], DOB:  [birth year], LOS: 0 ADMISSION DATE:  12/01/2023, CONSULTATION DATE: 12/01/2023 REFERRING MD: Elida Ross, MD, CHIEF COMPLAINT: Acute stroke  History of Present Illness:   89 year old with CKD, depression, A-fib, GERD, hypertension presenting with acute right MCA stroke.  Intubated for airway protection Status post IR thrombectomy and stent placement.  Pertinent  Medical History    has a past medical history of CKD (chronic kidney disease), Depression, GERD (gastroesophageal reflux disease), History of kidney stones, Hypertension, Osteoarthritis of lumbar spine, Primary localized osteoarthrosis of the knee, left, Radicular syndrome of left leg, and Vitamin D  deficiency.   Significant Hospital Events: Including procedures, antibiotic start and stop dates in addition to other pertinent events     Interim History / Subjective:    Objective    Blood pressure 135/70, pulse 75, resp. rate (!) 8, height 5' 9 (1.753 m), weight 80 kg, SpO2 100%.        Intake/Output Summary (Last 24 hours) at 12/01/2023 1535 Last data filed at 12/01/2023 1449 Gross per 24 hour  Intake 800 ml  Output --  Net 800 ml   Filed Weights   12/01/23 1133  Weight: 80 kg    Examination: Blood pressure 135/70, pulse 75, resp. rate (!) 8, height 5' 9 (1.753 m), weight 80 kg, SpO2 100%. Gen:   Elderly female in no acute distress HEENT:  EOMI, sclera anicteric Neck:     No masses; no thyromegaly, ET tube Lungs:    Clear to auscultation bilaterally; normal respiratory effort CV:         Regular rate and rhythm; no murmurs Abd:      + bowel sounds; soft, non-tender; no palpable masses, no distension Ext:    No edema; adequate peripheral perfusion Neuro: Sedated, unresponsive  Lab/imaging reviewed Significant for potassium 3.4, BUN/creatinine 17/1.40 WBC 5.0, hemoglobin 10.4, platelets 221  Resolved problem list   Assessment and Plan  Acute right MCA  stroke Status post thrombectomy MRI brain, echocardiogram Cleviprex drip for BP control  Vent dependence due to stroke Keep on ventilator for now Assess for SBT's in the morning Check ABG, chest x-ray  Atrial fibrillation Holding Lopressor . Not on anticoagulation as an outpatient Telemetry monitoring  Hypertension Hold outpatient medications  GERD Start PPI  Critical care time:    The patient is critically ill with multiple organ system failure and requires high complexity decision making for assessment and support, frequent evaluation and titration of therapies, advanced monitoring, review of radiographic studies and interpretation of complex data.   Critical Care Time devoted to patient care services, exclusive of separately billable procedures, described in this note is 35 minutes.   Makail Watling MD [facility] See Amion for pager  If no response to pager , please call [phone] until 7pm After 7:00 pm call Elink  [phone] 12/01/2023, 3:55 PM

## 2023-12-01 NOTE — Anesthesia Procedure Notes (Signed)
 Procedure Name: Intubation Date/Time: 12/01/2023 1:11 PM  Performed by: Vertie Arthea RAMAN, CRNAPre-anesthesia Checklist: Patient identified, Emergency Drugs available, Suction available and Patient being monitored Patient Re-evaluated:Patient Re-evaluated prior to induction Oxygen Delivery Method: Circle System Utilized Preoxygenation: Pre-oxygenation with 100% oxygen Induction Type: IV induction Ventilation: Mask ventilation without difficulty Laryngoscope Size: Mac and 3 Tube type: Oral Tube size: 7.0 mm Number of attempts: 1 Airway Equipment and Method: Stylet and Oral airway Placement Confirmation: ETT inserted through vocal cords under direct vision, positive ETCO2 and breath sounds checked- equal and bilateral Tube secured with: Tape Dental Injury: Teeth and Oropharynx as per pre-operative assessment

## 2023-12-01 NOTE — Progress Notes (Signed)
 eLink Physician-Brief Progress Note Patient Name: Heather Holt DOB: 1930-08-30 MRN: [id number]   Date of Service  12/01/2023  HPI/Events of Note  on vent with prop at 70, very dyssynch on vent , asking for fentanyl  either drip or prn  eICU Interventions  Will start with PRN fentanyl       Intervention Category Major Interventions: Respiratory failure - evaluation and management  Raynor Calcaterra G Zionna Homewood 12/01/2023, 9:37 PM

## 2023-12-01 NOTE — Anesthesia Postprocedure Evaluation (Signed)
 Anesthesia Post Note  Patient: Heather Holt  Procedure(s) Performed: RADIOLOGY WITH ANESTHESIA     Patient location during evaluation: ICU Anesthesia Type: General Level of consciousness: sedated and patient remains intubated per anesthesia plan Pain management: pain level controlled Vital Signs Assessment: post-procedure vital signs reviewed and stable Respiratory status: patient remains intubated per anesthesia plan and patient on ventilator - see flowsheet for VS Cardiovascular status: stable (BP controlled with Cleviprex) Postop Assessment: no apparent nausea or vomiting Anesthetic complications: no Comments: Pt remains intubated and sedated   No notable events documented.  Last Vitals:  Vitals:   12/01/23 1200 12/01/23 1230  BP: (!) 174/104 (!) 190/96  Pulse: (!) 104 93  Resp: 14 (!) 35  SpO2: 100% 96%    Last Pain: There were no vitals filed for this visit.               Jacqlyn Marolf,E. Carmello Cabiness

## 2023-12-01 NOTE — Progress Notes (Signed)
 1500:  Patient transferred from IR to 4NICU intubated.  Bedside report received from IR RN and CRNA.  Groin site intact with palpable pulses.  Family at bedside and returned patient's belonging to son (belonging include rings and clothing).  CCM notified.

## 2023-12-01 NOTE — Transfer of Care (Signed)
 Immediate Anesthesia Transfer of Care Note  Patient: Heather Holt  Procedure(s) Performed: RADIOLOGY WITH ANESTHESIA  Patient Location: ICU  Anesthesia Type:General  Level of Consciousness: sedated and unresponsive  Airway & Oxygen Therapy: Patient remains intubated per anesthesia plan and Patient placed on Ventilator (see vital sign flow sheet for setting)  Post-op Assessment: Report given to RN and Post -op Vital signs reviewed and stable  Post vital signs: Reviewed and stable  Last Vitals:  Vitals Value Taken Time  BP 151/83 12/01/23 15:00  Temp    Pulse 87 12/01/23 15:00  Resp 9 12/01/23 15:00  SpO2 100 % 12/01/23 15:00  Vitals shown include unfiled device data.  Last Pain: There were no vitals filed for this visit.       Complications: No notable events documented.

## 2023-12-01 NOTE — Code Documentation (Signed)
 Stroke Response Nurse Documentation Code Documentation  Heather Holt is a 89 y.o. female arriving to [facility]  via [facility] on 12/01/23 with past medical hx of CKD, depression, persistent afib, GERD, HTN, osteoarthritis, vitamin D  deficiency . On Pradaxa but unsure of last time taken. Code stroke was activated by EMS.   Patient from home where she was LKW at 8pm last night and now complaining of right gaze, left sided weakness. When pt did not show up to church, friends did a wellness check on her and found her in her bed minimally responsive with right sided gaze and left weakness.   Stroke team at the bedside on patient arrival. Labs drawn and patient cleared for CT by Dr. Ginger. Patient to CT with team. NIHSS 25, see documentation for details and code stroke times.  The following imaging was completed:  CT Head, CTA, and CTP. Patient is not a candidate for IV Thrombolytic due to outside window. Patient is a candidate for IR due to LVO positive.   Care Plan: To IR, report given to IR RN- CC  Process Delays Noted: Difficult IV access   Bedside handoff with ED RN.    Nat Blunt  Stroke Response RN

## 2023-12-01 NOTE — Anesthesia Preprocedure Evaluation (Addendum)
 Anesthesia Evaluation  Patient identified by MRN, date of birth, ID band Patient awake  General Assessment Comment:Pt awake, yet aphasic and unable to respond  Reviewed: Allergy & Precautions, Patient's Chart, lab work & pertinent test results, reviewed documented beta blocker date and time , Unable to perform ROS - Chart review onlyPreop documentation limited or incomplete due to emergent nature of procedure.  Airway Mallampati: Unable to assess  TM Distance: >3 FB Neck ROM: Limited    Dental  (+) Dental Advisory Given   Pulmonary former smoker   breath sounds clear to auscultation       Cardiovascular hypertension, Pt. on medications and Pt. on home beta blockers  Rhythm:Irregular Rate:Tachycardia  '18 Stress: Nuclear stress EF: 56%. The left ventricular EF (55-65%). There was accentuation of the T wave abnormality in the inferolateral leads during infusion There is a small defect present in the apical anterior and apical septal location. The defect is partially reversible. This is likely due to variations in breast attenuation artifact. This is a low risk study.      Neuro/Psych CVA (acute CVA)    GI/Hepatic ,GERD  Medicated,,  Endo/Other  BMI 26  Renal/GU Renal InsufficiencyRenal disease     Musculoskeletal  (+) Arthritis ,    Abdominal   Peds  Hematology Hb 13.6, plt 158k   Anesthesia Other Findings   Reproductive/Obstetrics                              Anesthesia Physical Anesthesia Plan  ASA: 4 and emergent  Anesthesia Plan: General   Post-op Pain Management: Minimal or no pain anticipated   Induction: Intravenous and Rapid sequence  PONV Risk Score and Plan: 3  Airway Management Planned: Oral ETT  Additional Equipment: None  Intra-op Plan:   Post-operative Plan: Post-operative intubation/ventilation  Informed Consent:      Only emergency history available  and History available from chart only  Plan Discussed with: CRNA and Surgeon  Anesthesia Plan Comments: (No notes from ED, IR team/MD not present, pt non-communicative)         Anesthesia Quick Evaluation

## 2023-12-01 NOTE — H&P (Signed)
 NEUROLOGY H&P NOTE   Date of service: December 01, 2023 Patient Name: Heather Holt MRN:  [id number] DOB:  06/27/1930 Chief Complaint: Code Stroke  History of Present Illness  Heather Holt is a 89 y.o. female with hx of CKD, depression, persistent afib, GERD, HTN, osteoarthritis, vitamin D  deficiency presenting as a code stroke via EMS. She was last seen in her usual state of health last night around 8pm. This morning she did not show up for church and her family called a well fair check. Police and fire had to break down her door and she was found on her bed. In afib on arrival to ED with SBP 160, glucose 131. It appears that her pradaxa was denied per notes from [facility] and EMS was unable to find it when they looked through her medications.    Last known well: 10/11 8pm Modified rankin score: 0-Completely asymptomatic and back to baseline post- stroke IV Thrombolysis: No, outside of the window Thrombectomy: Yes, phone consent obtained via 3 way phone call with son Heather Holt HCPOA Delay due to IV access  NIHSS components Score: Comment  1a Level of Conscious 0[]  1[x]  2[]  3[]      1b LOC Questions 0[]  1[]  2[x]       1c LOC Commands 0[]  1[]  2[x]       2 Best Gaze 0[]  1[]  2[x]       3 Visual 0[]  1[]  2[x]  3[]      4 Facial Palsy 0[]  1[]  2[x]  3[]      5a Motor Arm - left 0[]  1[]  2[]  3[]  4[x]  UN[]    5b Motor Arm - Right 0[x]  1[]  2[]  3[]  4[]  UN[]    6a Motor Leg - Left 0[]  1[]  2[]  3[x]  4[]  UN[]    6b Motor Leg - Right 0[]  1[x]  2[]  3[]  4[]  UN[]    7 Limb Ataxia 0[x]  1[]  2[]  UN[]      8 Sensory 0[]  1[x]  2[]  UN[]      9 Best Language 0[]  1[]  2[x]  3[]      10 Dysarthria 0[]  1[x]  2[]  UN[]      11 Extinct. and Inattention 0[]  1[]  2[x]       TOTAL:25       ROS   Unable to perform due to altered mental status   Past History   Past Medical History:  Diagnosis Date   CKD (chronic kidney disease)    Depression    GERD (gastroesophageal reflux disease)    History of kidney stones    Hypertension     Osteoarthritis of lumbar spine    Primary localized osteoarthrosis of the knee, left    Radicular syndrome of left leg    Vitamin D  deficiency    Past Surgical History:  Procedure Laterality Date   BACK SURGERY  1969   COLONOSCOPY     EYE SURGERY Bilateral    cataract surgery   Family History  Problem Relation Age of Onset   Cancer Mother    Diabetes Sister    Social History   Socioeconomic History   Marital status: Widowed    Spouse name: Not on file   Number of children: Not on file   Years of education: Not on file   Highest education level: Not on file  Occupational History   Not on file  Tobacco Use   Smoking status: Former    Current packs/day: 0.00    Types: Cigarettes    Quit date: 04/21/1986    Years since quitting: 37.6   Smokeless tobacco: Never  Vaping Use  Vaping status: Never Used  Substance and Sexual Activity   Alcohol use: No   Drug use: No   Sexual activity: Not on file  Other Topics Concern   Not on file  Social History Narrative   Not on file   Social Drivers of Health   Financial Resource Strain: Low Risk  (08/29/2018)   Received from [facility] visits prior to 04/21/2022.   Overall Financial Resource Strain (CARDIA)    Difficulty of Paying Living Expenses: Not hard at all  Food Insecurity: Low Risk  (10/17/2023)   Received from [facility]   Hunger Vital Sign    Within the past 12 months, you worried that your food would run out before you got money to buy more: Never true    Within the past 12 months, the food you bought just didn't last and you didn't have money to get more. : Never true  Transportation Needs: No Transportation Needs (10/17/2023)   Received from [facility]    In the past 12 months, has lack of reliable transportation kept you from medical appointments, meetings, work or from getting things needed for daily living? : No  Physical Activity: Not on file  Stress: Not on file   Social Connections: Not on file   Allergies  Allergen Reactions   Amlodipine Swelling and Other (See Comments)    SWELLING REACTION, EDEMA UNSPECIFIED    Medications   Current Facility-Administered Medications:    [START ON 12/02/2023]  stroke: early stages of recovery book, , Does not apply, Once, Remi Pippin, NP   0.9 %  sodium chloride  infusion, , Intravenous, Continuous, Shafer, Devon, NP   acetaminophen  (TYLENOL ) tablet 650 mg, 650 mg, Oral, Q4H PRN **OR** acetaminophen  (TYLENOL ) 160 MG/5ML solution 650 mg, 650 mg, Per Tube, Q4H PRN **OR** acetaminophen  (TYLENOL ) suppository 650 mg, 650 mg, Rectal, Q4H PRN, Remi, Devon, NP   ondansetron  (ZOFRAN ) 4 MG/2ML injection, , , ,    ondansetron  (ZOFRAN ) injection 4 mg, 4 mg, Intravenous, Q6H PRN, Remi, Devon, NP, 4 mg at 12/01/23 1218   senna-docusate (Senokot-S) tablet 1 tablet, 1 tablet, Oral, QHS PRN, Remi Pippin, NP   sodium chloride  flush (NS) 0.9 % injection 3 mL, 3 mL, Intravenous, Once, Tegeler, Lonni PARAS, MD  Current Outpatient Medications:    benazepril  (LOTENSIN ) 40 MG tablet, Take 40 mg by mouth daily., Disp: , Rfl:    cholecalciferol  (VITAMIN D ) 1000 units tablet, Take 1,000 Units by mouth daily., Disp: , Rfl:    HYDROcodone -acetaminophen  (NORCO/VICODIN) 5-325 MG tablet, Take 1 tablet by mouth every 8 (eight) hours as needed for moderate pain., Disp: , Rfl:    metoprolol  succinate (TOPROL -XL) 100 MG 24 hr tablet, Take 100 mg by mouth daily., Disp: , Rfl:    pantoprazole  (PROTONIX ) 40 MG tablet, Take 40 mg by mouth daily., Disp: , Rfl:    terazosin  (HYTRIN ) 2 MG capsule, Take 2 mg by mouth at bedtime. , Disp: , Rfl:    triamterene -hydrochlorothiazide  (DYAZIDE ) 37.5-25 MG capsule, Take 1 capsule by mouth daily., Disp: , Rfl:    Vitals   Vitals:   12/01/23 1133 12/01/23 1200 12/01/23 1230  BP:  (!) 174/104 (!) 190/96  Pulse:  (!) 104 93  Resp:  14 (!) 35  SpO2:  100% 96%  Weight: 80 kg       Body mass index  is 26.05 kg/m.  Physical Exam   Constitutional: Appears well-developed and well-nourished.  Psych: Affect  appropriate to situation.  Eyes: No scleral injection.  HENT: No OP obstruction.  Head: Normocephalic.  Cardiovascular: tachycardic, in afib on monitor Respiratory: Effort normal, non-labored breathing.  GI: Soft.  No distension. There is no tenderness.  Skin: WDI.   Neurologic Examination    Neuro: Mental Status: Patient is awake, alert, states I'm fine but did not answer orientation questions  Speech is dysarthric, does not name objects, but does speak spontaneously  Left sided neglect Cranial Nerves: II: Does not blink to threat consistently on the left III,IV, VI: Forced right gaze V: Facial sensation is symmetric to temperature VII: right facial droop  VIII: Hearing is intact to voice X: Palate elevates symmetrically XI: Shoulder shrug is symmetric. XII: Tongue protrudes midline without atrophy or fasciculations.  Motor: Bulk is normal.  RUE 5/5 LUE 0/5 with increased tone  RLE 4/5 LLE 2/5 with increased tone  Sensory: Withdraws less on the left than the right to noxious stimuli  Cerebellar: FNF and HKS are intact on the right  Unable to complete on the left    Labs   CBC:  Recent Labs  Lab 12/01/23 1128 12/01/23 1135  WBC 4.5  --   NEUTROABS 3.6  --   HGB 12.3 13.6  HCT 39.5 40.0  MCV 89.0  --   PLT 158  --    Basic Metabolic Panel:  Lab Results  Component Value Date   NA 144 12/01/2023   K 3.4 (L) 12/01/2023   CO2 24 12/01/2023   GLUCOSE 146 (H) 12/01/2023   BUN 17 12/01/2023   CREATININE 1.40 (H) 12/01/2023   CALCIUM 9.4 12/01/2023   GFRNONAA 37 (L) 12/01/2023   GFRAA 35 (L) 07/19/2016   Lipid Panel: No results found for: LDLCALC HgbA1c: No results found for: HGBA1C Urine Drug Screen: No results found for: LABOPIA, COCAINSCRNUR, LABBENZ, AMPHETMU, THCU, LABBARB  Alcohol Level     Component Value Date/Time   [facility]  <15 12/01/2023 1128   INR  Lab Results  Component Value Date   INR 1.1 12/01/2023   APTT  Lab Results  Component Value Date   APTT 29 12/01/2023     CT Head without contrast(Personally reviewed): 1. Acute right MCA territory infarct.  ASPECTS 6/10 2. No hyperdense vessel. 3. Atherosclerotic calcifications in the cavernous carotid arteries bilaterally. 4. Right periorbital soft tissue swelling without underlying fracture or foreign body  CT angio Head and Neck with contrast(Personally reviewed): 1. Distal right M1 occlusion with poor collateralization of the anterior and superior divisions. 2. Core infarct of 13 ml in the right frontal operculum and anterior right frontal lobe. 3. Larger area of ischemia in the right MCA territory measuring 94 ml, with a mismatch volume of 81 ml and a mismatch ratio of 7.2.  Assessment   Heather Holt is a 89 y.o. female with a hx of CKD, depression, persistent afib, GERD, HTN, osteoarthritis, vitamin D  deficiency presenting with left sided weakness, right gaze, dysarthria. CT with right MCA territory infarct, aspects 6.  Favorable perfusion and after discussion with on neuro radiologist and family the decision was made to proceed with EVT. Plan to admit to 4N ICU post procedure.   Primary Diagnosis:  Cerebral infarction due to embolism of  right middle cerebral artery.   Secondary Diagnosis: Essential (primary) hypertension, Chronic atrial fibrillation, and CKD Stage 3 (GFR 30-59)  Recommendations   -HgbA1c, fasting lipid panel - MRI of the brain without contrast - Frequent neuro checks - BP goal per  IR  - Echocardiogram - Prophylactic therapy-will likely need to resume anticoagulation  - Risk factor modification - Telemetry monitoring - PT consult, OT consult, Speech consult  ______________________________________________________________________   Signed, Jorene Last, NP [facility]  Risks, benefits and alternatives  of IVT discussed with patient and/or family and they agreed.   Attending Neurohospitalist Addendum Patient seen and examined with APP/Resident. Agree with the history and physical as documented above. Agree with the plan as documented, which I helped formulate. I have edited the note above to reflect my full findings and recommendations. I have independently reviewed the chart, obtained history, review of systems and examined the patient.I have personally reviewed pertinent head/neck/spine imaging (CT/MRI). Please feel free to call with any questions.  This patient is critically ill and at significant risk of neurological worsening, death and care requires constant monitoring of vital signs, hemodynamics,respiratory and cardiac monitoring, neurological assessment, discussion with family, other specialists and medical decision making of high complexity. I spent 95 minutes of neurocritical care time  in the care of  this patient. This was time spent independent of any time provided by nurse practitioner or PA.  Heather Ross, MD [facility] [phone]  If 7pm- 7am, please page neurology on call as listed in AMION.

## 2023-12-01 NOTE — ED Provider Notes (Signed)
 Heather Holt EMERGENCY DEPARTMENT AT [facility] Provider Note   CSN: [id number] Arrival date & time: 12/01/23  1124     Patient presents with: No chief complaint on file.   Heather Holt is a 89 y.o. female.   The history is provided by the EMS personnel and medical records. No language interpreter was used.  Neurologic Problem This is a new problem. Episode onset: last normal 8pm. The problem occurs constantly. The problem has not changed since onset.Pertinent negatives include no chest pain, no abdominal pain, no headaches and no shortness of breath. Nothing aggravates the symptoms. Nothing relieves the symptoms. She has tried nothing for the symptoms.       Prior to Admission medications   Medication Sig Start Date End Date Taking? Authorizing Provider  benazepril  (LOTENSIN ) 40 MG tablet Take 40 mg by mouth daily. 01/31/16   [provider]  cholecalciferol  (VITAMIN D ) 1000 units tablet Take 1,000 Units by mouth daily.    [provider]  HYDROcodone -acetaminophen  (NORCO/VICODIN) 5-325 MG tablet Take 1 tablet by mouth every 8 (eight) hours as needed for moderate pain.    [provider]  metoprolol  succinate (TOPROL -XL) 100 MG 24 hr tablet Take 100 mg by mouth daily. 11/07/15   [provider]  pantoprazole  (PROTONIX ) 40 MG tablet Take 40 mg by mouth daily. 01/23/16   [provider]  terazosin  (HYTRIN ) 2 MG capsule Take 2 mg by mouth at bedtime.  02/22/16   [provider]  triamterene -hydrochlorothiazide  (DYAZIDE ) 37.5-25 MG capsule Take 1 capsule by mouth daily. 09/26/15   [provider]    Allergies: Amlodipine    Review of Systems  Unable to perform ROS: Acuity of condition  Respiratory:  Negative for shortness of breath.   Cardiovascular:  Negative for chest pain.  Gastrointestinal:  Negative for abdominal pain.  Neurological:  Positive for facial asymmetry, speech difficulty and weakness. Negative  for headaches.    Updated Vital Signs Wt 80 kg   BMI 26.05 kg/m   Physical Exam Vitals and nursing note reviewed.  Constitutional:      General: She is not in acute distress.    Appearance: She is well-developed. She is ill-appearing. She is not diaphoretic.  HENT:     Head: Atraumatic.     Nose: No congestion or rhinorrhea.     Mouth/Throat:     Mouth: Mucous membranes are moist.     Pharynx: No oropharyngeal exudate or posterior oropharyngeal erythema.  Eyes:     Conjunctiva/sclera: Conjunctivae normal.  Cardiovascular:     Rate and Rhythm: Normal rate and regular rhythm.     Heart sounds: No murmur heard. Pulmonary:     Effort: Pulmonary effort is normal. No respiratory distress.     Breath sounds: Normal breath sounds. No wheezing, rhonchi or rales.  Chest:     Chest wall: No tenderness.  Abdominal:     Palpations: Abdomen is soft.     Tenderness: There is no abdominal tenderness.  Musculoskeletal:        General: No swelling or tenderness.     Cervical back: Neck supple.  Skin:    General: Skin is warm and dry.     Capillary Refill: Capillary refill takes less than 2 seconds.  Neurological:     Mental Status: She is alert.     Cranial Nerves: Dysarthria and facial asymmetry present.     Sensory: Sensory deficit present.     Motor: Weakness present.  Comments: Patient has right gaze preference, dysarthria, left face, left arm, left leg numbness and weakness.  Psychiatric:        Mood and Affect: Mood normal.     (all labs ordered are listed, but only abnormal results are displayed) Labs Reviewed  DIFFERENTIAL - Abnormal; Notable for the following components:      Result Value   Lymphs Abs 0.6 (*)    All other components within normal limits  COMPREHENSIVE METABOLIC PANEL WITH GFR - Abnormal; Notable for the following components:   Potassium 3.4 (*)    Glucose, Bld 149 (*)    Creatinine, Ser 1.35 (*)    Total Bilirubin 1.6 (*)    GFR, Estimated 37 (*)     All other components within normal limits  I-STAT CHEM 8, ED - Abnormal; Notable for the following components:   Potassium 3.4 (*)    Creatinine, Ser 1.40 (*)    Glucose, Bld 146 (*)    All other components within normal limits  CBG MONITORING, ED - Abnormal; Notable for the following components:   Glucose-Capillary 131 (*)    All other components within normal limits  PROTIME-INR  APTT  CBC  ETHANOL    EKG: None  Radiology: CT HEAD CODE STROKE WO CONTRAST Result Date: 12/01/2023 EXAM: CT HEAD WITHOUT CONTRAST 12/01/2023 11:35:11 AM TECHNIQUE: CT of the head was performed without the administration of intravenous contrast. Automated exposure control, iterative reconstruction, and/or weight based adjustment of the mA/kV was utilized to reduce the radiation dose to as low as reasonably achievable. COMPARISON: 07/06/2016 CLINICAL HISTORY: Neuro deficit, acute, stroke suspected. FINDINGS: BRAIN AND VENTRICLES: No acute hemorrhage. Loss of gray-white differentiation is present in the supratentorial right frontal lobe and in the right frontal operculum. The anterior right insular cortex is obscured. The right lentiform nucleus is isoechoic to white matter. The caudate head is intact. Atrophy and other white matter changes demonstrate some suspected progression. Atherosclerotic calcifications are present in the cavernous carotid arteries bilaterally. No hyperdense vessel is present. No extra-axial collection. No mass effect or midline shift. No hydrocephalus. ORBITS: Right periorbital soft tissue swelling is present without underlying fracture or foreign body. Bilateral lens replacements are noted. The globes and orbits are otherwise within normal limits. SINUSES: No acute abnormality. SOFT TISSUES AND SKULL: Right periorbital soft tissue swelling is present without underlying fracture or foreign body. A left supraorbital scalp lipoma is present. No skull fracture. ASPECT Score: Ganglionic  (caudate, ic, lentiform nucleus, insula, M1-m3): 2 Supraganglionic (m4-m6): 4 Total: 6 IMPRESSION: 1. Acute right MCA territory infarct.  ASPECTS 6/10 2. No hyperdense vessel. 3. Atherosclerotic calcifications in the cavernous carotid arteries bilaterally. 4. Right periorbital soft tissue swelling without underlying fracture or foreign body. Question of trauma? Critical findings were called to Dr. Matthews at 11:40 am. Electronically signed by: Lonni Necessary MD 12/01/2023 11:46 AM EDT RP Workstation: [id number]     Procedures   EMERGENCY DEPARTMENT  US  GUIDANCE EXAM Emergency Ultrasound:  US  Guidance for Needle Guidance  INDICATIONS: Difficult vascular access Linear probe used in real-time to visualize location of needle entry through skin.   PERFORMED BY: Myself IMAGES ARCHIVED?: No LIMITATIONS: poor veins VIEWS USED: Transverse INTERPRETATION: Needle visualized within vein, Left arm, and Needle gauge 18   CRITICAL CARE Performed by: Lonni PARAS Brinley Treanor Total critical care time: 35 minutes Critical care time was exclusive of separately billable procedures and treating other patients. Critical care was necessary to treat or prevent imminent or life-threatening deterioration.  Critical care was time spent personally by me on the following activities: development of treatment plan with patient and/or surrogate as well as nursing, discussions with consultants, evaluation of patient's response to treatment, examination of patient, obtaining history from patient or surrogate, ordering and performing treatments and interventions, ordering and review of laboratory studies, ordering and review of radiographic studies, pulse oximetry and re-evaluation of patient's condition.  Medications Ordered in the ED  sodium chloride  flush (NS) 0.9 % injection 3 mL (has no administration in time range)  iohexol (OMNIPAQUE) 350 MG/ML injection 100 mL (100 mLs Intravenous Contrast Given 12/01/23 1207)                                     Medical Decision Making Amount and/or Complexity of Data Reviewed Labs: ordered. Radiology: ordered.    Heather Holt is a 89 y.o. female with a past medical history significant for hypertension, GERD, previous kidney stones, CKD, osteoarthritis, and possible dabigatran use who presents as a code stroke.  According to EMS report, patient was last normal at 8 PM going to bed last night and then this morning was unresponsive when family did a well check to go to church.  Patient has left-sided deficits, left-sided neglect, and has a rightward gaze preference.  Patient is saying some answers to questions but does not answer questions all appropriately.  She does have right facial droop on arrival.  Patient quickly taken to CT scanner for imaging as we are concerned about stroke or LVO.  On my brief exam, patient does not did have weakness in left arm left leg and left face.  Decree sensation on the left side.  Right gaze preference.  Pupils are symmetric and reactive however.  Speech is somewhat dysarthric.  Otherwise lungs clear and chest nontender.  Abdomen nontender.  Ultrasound-guided IV was placed by me as IVs Blowing.  Anticipate admission for acute stroke after workup is completed.  Neurology speaking to family to determine management plan.  12:37 PM Was informed by neurology that patient is now a code IR and will go to the interventional radiology suite for possible intracranial intervention.  They will admit to the ICU for further management after.      Final diagnoses:  Cerebrovascular accident (CVA), unspecified mechanism (HCC)  Acute left-sided muscle weakness     Clinical Impression: 1. Cerebrovascular accident (CVA), unspecified mechanism (HCC)   2. Acute left-sided muscle weakness     Disposition: Admit  This note was prepared with assistance of Dragon voice recognition software. Occasional wrong-word or sound-a-like  substitutions may have occurred due to the inherent limitations of voice recognition software.      Abygayle Deltoro, Lonni PARAS, MD 12/01/23 1242

## 2023-12-02 ENCOUNTER — Inpatient Hospital Stay (HOSPITAL_COMMUNITY)

## 2023-12-02 ENCOUNTER — Other Ambulatory Visit (HOSPITAL_COMMUNITY): Payer: Self-pay

## 2023-12-02 ENCOUNTER — Telehealth (HOSPITAL_COMMUNITY): Payer: Self-pay | Admitting: Pharmacy Technician

## 2023-12-02 ENCOUNTER — Encounter (HOSPITAL_COMMUNITY): Payer: Self-pay | Admitting: Radiology

## 2023-12-02 DIAGNOSIS — G936 Cerebral edema: Secondary | ICD-10-CM

## 2023-12-02 DIAGNOSIS — I4819 Other persistent atrial fibrillation: Secondary | ICD-10-CM

## 2023-12-02 DIAGNOSIS — I63511 Cerebral infarction due to unspecified occlusion or stenosis of right middle cerebral artery: Secondary | ICD-10-CM | POA: Diagnosis not present

## 2023-12-02 DIAGNOSIS — I6389 Other cerebral infarction: Secondary | ICD-10-CM | POA: Diagnosis not present

## 2023-12-02 DIAGNOSIS — R233 Spontaneous ecchymoses: Secondary | ICD-10-CM | POA: Diagnosis not present

## 2023-12-02 DIAGNOSIS — I10 Essential (primary) hypertension: Secondary | ICD-10-CM | POA: Diagnosis not present

## 2023-12-02 DIAGNOSIS — K219 Gastro-esophageal reflux disease without esophagitis: Secondary | ICD-10-CM | POA: Diagnosis not present

## 2023-12-02 DIAGNOSIS — E785 Hyperlipidemia, unspecified: Secondary | ICD-10-CM

## 2023-12-02 DIAGNOSIS — I63411 Cerebral infarction due to embolism of right middle cerebral artery: Secondary | ICD-10-CM | POA: Diagnosis not present

## 2023-12-02 DIAGNOSIS — I4891 Unspecified atrial fibrillation: Secondary | ICD-10-CM | POA: Diagnosis not present

## 2023-12-02 LAB — CBC
HCT: 39 % (ref 36.0–46.0)
Hemoglobin: 12.5 g/dL (ref 12.0–15.0)
MCH: 27.7 pg (ref 26.0–34.0)
MCHC: 32.1 g/dL (ref 30.0–36.0)
MCV: 86.3 fL (ref 80.0–100.0)
Platelets: 163 K/uL (ref 150–400)
RBC: 4.52 MIL/uL (ref 3.87–5.11)
RDW: 14.7 % (ref 11.5–15.5)
WBC: 10.3 K/uL (ref 4.0–10.5)
nRBC: 0 % (ref 0.0–0.2)

## 2023-12-02 LAB — LIPID PANEL
Cholesterol: 126 mg/dL (ref 0–200)
HDL: 49 mg/dL (ref >40)
LDL Cholesterol: 61 mg/dL (ref 0–99)
Total CHOL/HDL Ratio: 2.6 ratio
Triglycerides: 80 mg/dL (ref <150)
VLDL: 16 mg/dL (ref 0–40)

## 2023-12-02 LAB — BASIC METABOLIC PANEL WITH GFR
Anion gap: 12 (ref 5–15)
BUN: 15 mg/dL (ref 8–23)
CO2: 22 mmol/L (ref 22–32)
Calcium: 9.3 mg/dL (ref 8.9–10.3)
Chloride: 107 mmol/L (ref 98–111)
Creatinine, Ser: 1.15 mg/dL — ABNORMAL HIGH (ref 0.44–1.00)
GFR, Estimated: 45 mL/min — ABNORMAL LOW (ref >60)
Glucose, Bld: 152 mg/dL — ABNORMAL HIGH (ref 70–99)
Potassium: 3.1 mmol/L — ABNORMAL LOW (ref 3.5–5.1)
Sodium: 141 mmol/L (ref 135–145)

## 2023-12-02 LAB — ECHOCARDIOGRAM COMPLETE
AR max vel: 2.5 cm2
AV Area VTI: 2.73 cm2
AV Area mean vel: 2.29 cm2
AV Mean grad: 5 mmHg
AV Peak grad: 10.4 mmHg
Ao pk vel: 1.61 m/s
Area-P 1/2: 4.65 cm2
Calc EF: 65.7 %
Height: 69 in
MV VTI: 3.81 cm2
S' Lateral: 2.8 cm
Single Plane A2C EF: 63.6 %
Single Plane A4C EF: 64.6 %
Weight: 2821.89 [oz_av]

## 2023-12-02 LAB — MAGNESIUM: Magnesium: 1.8 mg/dL (ref 1.7–2.4)

## 2023-12-02 LAB — HEMOGLOBIN A1C
Hgb A1c MFr Bld: 5.5 % (ref 4.8–5.6)
Mean Plasma Glucose: 111.15 mg/dL

## 2023-12-02 MED ORDER — FENTANYL CITRATE (PF) 100 MCG/2ML IJ SOLN
INTRAMUSCULAR | Status: AC
  Filled 2023-12-02: qty 2

## 2023-12-02 MED ORDER — POTASSIUM CHLORIDE 10 MEQ/100ML IV SOLN
10.0000 meq | INTRAVENOUS | Status: AC
  Administered 2023-12-02 (×6): 10 meq via INTRAVENOUS
  Filled 2023-12-02 (×6): qty 100

## 2023-12-02 MED ORDER — TORSEMIDE 20 MG PO TABS
20.0000 mg | ORAL_TABLET | Freq: Every day | ORAL | Status: AC
Start: 2023-12-02 — End: ?
  Administered 2023-12-03 – 2023-12-04 (×2): 20 mg
  Filled 2023-12-02 (×5): qty 1

## 2023-12-02 MED ORDER — MAGNESIUM SULFATE 2 GM/50ML IV SOLN
2.0000 g | Freq: Once | INTRAVENOUS | Status: AC
  Administered 2023-12-02: 2 g via INTRAVENOUS
  Filled 2023-12-02: qty 50

## 2023-12-02 MED ORDER — POTASSIUM CHLORIDE 20 MEQ PO PACK
60.0000 meq | PACK | Freq: Once | ORAL | Status: DC
  Filled 2023-12-02: qty 3

## 2023-12-02 MED ORDER — ATORVASTATIN CALCIUM 10 MG PO TABS
20.0000 mg | ORAL_TABLET | Freq: Every day | ORAL | Status: DC
  Administered 2023-12-03 – 2023-12-04 (×2): 20 mg
  Filled 2023-12-02 (×3): qty 2

## 2023-12-02 MED ORDER — PANTOPRAZOLE SODIUM 40 MG IV SOLR
40.0000 mg | INTRAVENOUS | Status: DC
  Administered 2023-12-02 – 2023-12-06 (×5): 40 mg via INTRAVENOUS
  Filled 2023-12-02 (×4): qty 10

## 2023-12-02 MED ORDER — CARVEDILOL 12.5 MG PO TABS
12.5000 mg | ORAL_TABLET | Freq: Two times a day (BID) | ORAL | Status: DC
  Administered 2023-12-02 – 2023-12-04 (×4): 12.5 mg
  Filled 2023-12-02 (×4): qty 1

## 2023-12-02 MED ORDER — HYALURONIDASE HUMAN 150 UNIT/ML IJ SOLN
150.0000 [IU] | Freq: Once | INTRAMUSCULAR | Status: AC
  Administered 2023-12-02: 150 [IU] via SUBCUTANEOUS
  Filled 2023-12-02: qty 1

## 2023-12-02 MED ORDER — SERTRALINE HCL 50 MG PO TABS
25.0000 mg | ORAL_TABLET | Freq: Every day | ORAL | Status: AC
Start: 2023-12-02 — End: ?
  Administered 2023-12-03 – 2023-12-04 (×2): 25 mg
  Filled 2023-12-02 (×3): qty 1

## 2023-12-02 MED ORDER — POTASSIUM CHLORIDE 10 MEQ/100ML IV SOLN
10.0000 meq | INTRAVENOUS | Status: DC
Start: 2023-12-02 — End: 2023-12-02
  Administered 2023-12-02 (×3): 10 meq via INTRAVENOUS
  Filled 2023-12-02 (×6): qty 100

## 2023-12-02 NOTE — Progress Notes (Incomplete)
 Attending note: I have seen and examined the patient. History, labs and imaging reviewed.  Blood pressure (!) 147/79, pulse 100, temperature 98.4 F (36.9 C), temperature source Oral, resp. rate 18, height 5' 9 (1.753 m), weight 80 kg, SpO2 100%. Gen:      No acute distress HEENT:  EOMI, sclera anicteric Neck:     No masses; no thyromegaly Lungs:    Clear to auscultation bilaterally; normal respiratory effort*** CV:         Regular rate and rhythm; no murmurs Abd:      + bowel sounds; soft, non-tender; no palpable masses, no distension Ext:    No edema; adequate peripheral perfusion Neuro: alert and oriented x 3 Psych: normal mood and affect   Labs/Imaging personally reviewed, significant for   Assessment/plan: Rt MCA stroke S/p thrombectomy MRI today  PSV weans and assess for extubation  The patient is critically ill with multiple organ systems failure and requires high complexity decision making for assessment and support, frequent evaluation and titration of therapies, application of advanced monitoring technologies and extensive interpretation of multiple databases.  Critical care time - 35 mins. This represents my time independent of the NPs time taking care of the pt.  Mohammad Granade MD  Pulmonary and Critical Care 12/02/2023, 10:44 AM

## 2023-12-02 NOTE — Progress Notes (Signed)
 eLink Physician-Brief Progress Note Patient Name: Heather Holt DOB: 12/04/1930 MRN: [id number]   Date of Service  12/02/2023  HPI/Events of Note  Notified of Kcl infiltration. Pharmacy asked for Hyaluronidase protocol   eICU Interventions  Order placed Continue to monitor site     Intervention Category Minor Interventions: Routine modifications to care plan (e.g. PRN medications for pain, fever)  Kasie Leccese G Avielle Imbert 12/02/2023, 4:44 AM

## 2023-12-02 NOTE — Progress Notes (Addendum)
 STROKE TEAM PROGRESS NOTE    SIGNIFICANT HOSPITAL EVENTS 10/12- Code stroke, IR   INTERIM HISTORY/SUBJECTIVE MRI and MRA today show right MCA territory infarct with some petechial hemorrhage and interval recannulization of the right M1.  She did require Seve some sedation for MRI so she has not been able to wean.  Did talk to family and they are wanting her to be a DNR however they have not made a decision yet regarding reintubation. Prior to receiving sedation she was following commands on the right upper and lower extremity.  Withdraws to noxious stimuli minimally in the left upper extremity and in the left lower extremity. CT head tomorrow prior to resuming anticoagulation.   OBJECTIVE  CBC    Component Value Date/Time   WBC 4.5 12/01/2023 1128   RBC 4.44 12/01/2023 1128   HGB 13.3 12/01/2023 1545   HCT 39.0 12/01/2023 1545   PLT 158 12/01/2023 1128   MCV 89.0 12/01/2023 1128   MCH 27.7 12/01/2023 1128   MCHC 31.1 12/01/2023 1128   RDW 14.4 12/01/2023 1128   LYMPHSABS 0.6 (L) 12/01/2023 1128   MONOABS 0.2 12/01/2023 1128   EOSABS 0.0 12/01/2023 1128   BASOSABS 0.0 12/01/2023 1128    BMET    Component Value Date/Time   NA 141 12/02/2023 0012   K 3.1 (L) 12/02/2023 0012   CL 107 12/02/2023 0012   CO2 22 12/02/2023 0012   GLUCOSE 152 (H) 12/02/2023 0012   BUN 15 12/02/2023 0012   CREATININE 1.15 (H) 12/02/2023 0012   CALCIUM 9.3 12/02/2023 0012   GFRNONAA 45 (L) 12/02/2023 0012    IMAGING past 24 hours CT ANGIO HEAD NECK W WO CM W PERF (CODE STROKE) Result Date: 12/01/2023 EXAM: CTA Head and Neck with Perfusion 12/01/2023 12:06:42 PM TECHNIQUE: CTA of the head and neck was performed without and with the administration of 100 mL of iohexol (OMNIPAQUE) 350 MG/ML injection. 3D postprocessing with multiplanar reconstructions and MIPs was performed to evaluate the vascular anatomy. Cerebral perfusion analysis using computed tomography with contrast administration,  including post-processing of parametric maps with determination of cerebral blood flow, cerebral blood volume, mean transit time and time-to-maximum. Automated exposure control, iterative reconstruction, and/or weight based adjustment of the mA/kV was utilized to reduce the radiation dose to as low as reasonably achievable. COMPARISON: None available CLINICAL HISTORY: Neuro deficit, acute, stroke suspected. FINDINGS: CTA NECK: AORTIC ARCH AND ARCH VESSELS: Atherosclerotic changes are present at the aortic arch and great vessel origins. No focal stenosis or aneurysm is present. CERVICAL CAROTID ARTERIES: No dissection, arterial injury, or hemodynamically significant stenosis by NASCET criteria. CERVICAL VERTEBRAL ARTERIES: Atherosclerotic calcifications are present bilaterally without significant stenosis. The left vertebral artery is dominant. No dissection or arterial injury. LUNGS AND MEDIASTINUM: Unremarkable. SOFT TISSUES: No acute abnormality. BONES: Multilevel degenerative changes are present in the cervical spine. CTA HEAD: ANTERIOR CIRCULATION: Atherosclerotic changes are present within the cavernous internal carotid arteries bilaterally without significant stenoses through the ICA termina. No significant stenosis of the anterior cerebral arteries. A distal right M1 occlusion is present. The posterior demonstrated branch vessels are opacified. Anterior and superior MCA branches are not opacified. Poor collateralization is present. No aneurysm. POSTERIOR CIRCULATION: No significant stenosis of the posterior cerebral arteries. No significant stenosis of the basilar artery. No significant stenosis of the vertebral arteries. No aneurysm. OTHER: A prominent right posterior communicating artery contributes. No dural venous sinus thrombosis on this non-dedicated study. CT PERFUSION: EXAM QUALITY: Exam quality is adequate with  diagnostic perfusion maps. No significant motion artifact. Appropriate arterial inflow and  venous outflow curves. CORE INFARCT (CBF<30% volume): 13 mL in the right frontal opaculum and anterior right frontal lobe. TOTAL HYPOPERFUSION (Tmax>6s volume): 94 mL across the right MCA territory. PENUMBRA: Mismatch volume: 81 mL Mismatch ratio: 7.2 Location: Right MCA territory. IMPRESSION: 1. Distal right M1 occlusion with poor collateralization of the anterior and superior divisions. 2. Core infarct of 13 ml in the right frontal operculum and anterior right frontal lobe. 3. Larger area of ischemia in the right MCA territory measuring 94 ml, with a mismatch volume of 81 ml and a mismatch ratio of 7.2. Clinical findings were called to Dr. Matthews at 12:10 pm. Electronically signed by: Lonni Necessary MD 12/01/2023 12:21 PM EDT RP Workstation: [id number]   CT HEAD CODE STROKE WO CONTRAST Result Date: 12/01/2023 EXAM: CT HEAD WITHOUT CONTRAST 12/01/2023 11:35:11 AM TECHNIQUE: CT of the head was performed without the administration of intravenous contrast. Automated exposure control, iterative reconstruction, and/or weight based adjustment of the mA/kV was utilized to reduce the radiation dose to as low as reasonably achievable. COMPARISON: 07/06/2016 CLINICAL HISTORY: Neuro deficit, acute, stroke suspected. FINDINGS: BRAIN AND VENTRICLES: No acute hemorrhage. Loss of gray-white differentiation is present in the supratentorial right frontal lobe and in the right frontal operculum. The anterior right insular cortex is obscured. The right lentiform nucleus is isoechoic to white matter. The caudate head is intact. Atrophy and other white matter changes demonstrate some suspected progression. Atherosclerotic calcifications are present in the cavernous carotid arteries bilaterally. No hyperdense vessel is present. No extra-axial collection. No mass effect or midline shift. No hydrocephalus. ORBITS: Right periorbital soft tissue swelling is present without underlying fracture or foreign body. Bilateral lens  replacements are noted. The globes and orbits are otherwise within normal limits. SINUSES: No acute abnormality. SOFT TISSUES AND SKULL: Right periorbital soft tissue swelling is present without underlying fracture or foreign body. A left supraorbital scalp lipoma is present. No skull fracture. ASPECT Score: Ganglionic (caudate, ic, lentiform nucleus, insula, M1-m3): 2 Supraganglionic (m4-m6): 4 Total: 6 IMPRESSION: 1. Acute right MCA territory infarct.  ASPECTS 6/10 2. No hyperdense vessel. 3. Atherosclerotic calcifications in the cavernous carotid arteries bilaterally. 4. Right periorbital soft tissue swelling without underlying fracture or foreign body. Question of trauma? Critical findings were called to Dr. Matthews at 11:40 am. Electronically signed by: Lonni Necessary MD 12/01/2023 11:46 AM EDT RP Workstation: [id number]    Vitals:   12/02/23 0600 12/02/23 0700 12/02/23 0730 12/02/23 0800  BP: (!) 148/84 (!) 150/95 (!) 163/90 (!) 154/80  Pulse: 93 93 (!) 108 95  Resp: 18 18 18 18   Temp:    98.4 F (36.9 C)  TempSrc:    Oral  SpO2: 100% 100% 100% 100%  Weight:      Height:          Physical Exam  Constitutional: NAD  Psych: sedation off Eyes: No scleral injection.  HENT: ETT in place Head: Normocephalic.  Cardiovascular: Normal rate and regular rhythm.  Respiratory: Mechanically ventilated  GI: Soft.  No distension. There is no tenderness.  Skin: WDI.   Neuro: Mental Status: Patient opens eyes Nodding appropriately Following does follow simple commands on right upper and lower extremity Cranial Nerves: intubated off sedation, eyes closed but open halfway on voice, able to follow simple commands on the R hand and foot. With forced eye opening, eyes in R gazse position not able to cross midline, intermittent blinking to visual threat  on the right but not on the left, not active tracking, PERRL. Corneal reflex present bilaterally stronger on the R, gag and cough present.   Breathing over the vent.  Facial symmetry not able to test due to ET tube.   Tongue protrusion not cooperative. On pain stimulation, slight withdraw of LUE and LLE, spontaneous movement of RUE and RLE against gravity.  Left babinski positive. Sensation, coordination not cooperative and gait not tested.     ASSESSMENT/PLAN  Heather Holt is a 89 y.o. female with history of CKD, depression, persistent afib, GERD, HTN, osteoarthritis, vitamin D  deficiency presenting as a code stroke via EMS.  NIH on Admission   Stroke:  right large MCA territory infarct with confluent petechial hemorrhage due to right M1 occlusion s/p IR with TICI2b, etiology: Likely Afib not on AC   CT Head Acute right MCA territory infarct.  ASPECTS 6/10 No hyperdense vessel. Atherosclerotic calcifications in the cavernous carotid arteries bilaterally.  CT angio Head and Neck Distal right M1 occlusion with poor collateralization of the anterior and superior divisions. CTP core infarct of 13 ml and larger area of ischemia in the right MCA territory measuring 94 ml.  However core infarct was underestimated due to pseudonormalization MRI  Acute large infarct in the right MCA territory with associated edema and slightly increased mass effect on the right lateral ventricle without significant midline shift. Heterogeneous areas of susceptibility in the right MCA territory concerning for petechial hemorrhage.  MRA  Interval recanalization of the M1 segment of the right MCA compared to the recent prior CTA. The MCA and distal branches appear patent without evidence of high-grade stenosis. CT repeat in the a.m. 2D Echo 60 to 65%, LA severely dilated  LDL 61 HgbA1c 5.5 VTE prophylaxis - SCDs No antithrombotic prior to admission, now antithrombotics on hold due to confluent petechial hemorrhage Therapy recommendations:  Pending Disposition: Pending  Atrial fibrillation Home Meds: prior auth for pradaxa was denied, does not  appear to have been taking any other anticoagulation.  Continue telemetry monitoring Rate controlled so far  Respiratory insuffiencey post procedure  CCM following Intubated on vent SBT today Extubate as able  Hypertension Home meds:  Coreg 25mg , hydralazine  100 mg 3 times daily, furosemide 20 mg daily Stable now with low dose cleviprex Blood Pressure Goal: SBP less than 160  Long-term BP goal normotensive  Hyperlipidemia Home meds: Atorvastatin 20mg , resumed in hospital LDL 61, goal < 70 Continue statin at discharge  Other active problems CKD Stage 3a, Cr 1.4-1.15, GFR 45 Hypokalemia, Replaced GERD, on PPI   Hospital day # 1  Patient seen and examined by NP/APP with MD. MD to update note as needed.   Jorene Last, DNP, FNP-BC [facility] Pager: [phone]   ATTENDING NOTE: I reviewed above note and agree with the assessment and plan. Pt was seen and examined.   Pt is intubated off sedation, eyes closed but open halfway on voice, able to follow simple commands on the R hand and foot. With forced eye opening, eyes in R gazse position not able to cross midline, intermittent blinking to visual threat on the right but not on the left, not active tracking, PERRL. Corneal reflex present bilaterally stronger on the R, gag and cough present. Breathing over the vent.  Facial symmetry not able to test due to ET tube.  Tongue protrusion not cooperative. On pain stimulation, slight withdraw of LUE and LLE, spontaneous movement of RUE and RLE against gravity. Left babinski positive.  Sensation, coordination not cooperative and gait not tested.   For detailed assessment and plan, please refer to above as I have made changes wherever appropriate.   Heather Cummins, MD PhD Stroke Neurology 12/02/2023 5:07 PM  This patient is critically ill due to large right MCA stroke, acute petechial hemorrhage, status post thrombectomy, cerebral edema, respiratory failure, A-fib not on AC  and at significant risk of neurological worsening, death form recurrent stroke, hemorrhagic termination, brain herniation, heart failure, seizure. This patient's care requires constant monitoring of vital signs, hemodynamics, respiratory and cardiac monitoring, review of multiple databases, neurological assessment, discussion with family, other specialists and medical decision making of high complexity. I spent 40 minutes of neurocritical care time in the care of this patient. I had long discussion with son and daughter-in-law at bedside, updated pt current condition, treatment plan and potential prognosis, and answered all the questions.  They expressed understanding and appreciation.      To contact Stroke Continuity provider, please refer to [email or web address]. After hours, contact General Neurology

## 2023-12-02 NOTE — Progress Notes (Signed)
 PT Cancellation Note  Patient Details Name: Heather Holt MRN: [id number] DOB: 1930-03-27   Cancelled Treatment:    Reason Eval/Treat Not Completed: Patient not medically ready  Patient remains intubated. Discussed with RN and not currently appropriate for PT evaluation. Will follow.   Heather Holt, PT Acute Rehabilitation Services  Office [phone]  Heather Holt 12/02/2023, 8:33 AM

## 2023-12-02 NOTE — Progress Notes (Signed)
   NAME:  Heather Holt, MRN:  [id number], DOB:  [birth year], LOS: 1 ADMISSION DATE:  12/01/2023, CONSULTATION DATE: 12/01/2023 REFERRING MD: Elida Ross, MD, CHIEF COMPLAINT: Acute stroke  History of Present Illness:   89 year old with CKD, depression, A-fib, GERD, hypertension presenting with acute right MCA stroke.  Intubated for airway protection Status post IR thrombectomy and stent placement.  Pertinent  Medical History    has a past medical history of CKD (chronic kidney disease), Depression, GERD (gastroesophageal reflux disease), History of kidney stones, Hypertension, Osteoarthritis of lumbar spine, Primary localized osteoarthrosis of the knee, left, Radicular syndrome of left leg, and Vitamin D  deficiency.   Significant Hospital Events: Including procedures, antibiotic start and stop dates in addition to other pertinent events   10/12 IR thrombectomy and stent placement 10/13, waking up and moving the R side, remains intubated   Interim History / Subjective:  No acute overnight events, patient is waking up off sedation and following commands on the R   Objective    Blood pressure (!) 154/80, pulse 95, temperature 98.4 F (36.9 C), temperature source Oral, resp. rate 18, height 5' 9 (1.753 m), weight 80 kg, SpO2 100%.    FiO2 (%):  [60 %] 60 %   Intake/Output Summary (Last 24 hours) at 12/02/2023 9167 Last data filed at 12/02/2023 0800 Gross per 24 hour  Intake 2357 ml  Output 910 ml  Net 1447 ml   Filed Weights   12/01/23 1133  Weight: 80 kg    General:  thin elderly F critically ill intubated and sedated  HEENT: MM pink/moist, ETT in place Neuro: PERRLA, moving the RUE and RLE to command CV: s1s2 rrr, no m/r/g PULM:  mechanically ventilated, synchronous and clear bilaterally  GI: soft, non-distended  Extremities: warm/dry, no edema     Labs: Creatinine 1.15 K 3.1 Mag 1.8   Resolved problem list   Assessment and Plan  Acute right MCA  stroke Status post thrombectomy -MRI brain, echocardiogram pending -off cleviprex    Vent dependence due to stroke -SBT this afternoon  Atrial fibrillation -resume lopressor  -Not on anticoagulation as an outpatient -Telemetry monitoring  Hypertension -resume Coreg, monitor and resume hydral prn -goal SBP <160  GERD -Start PPI  Critical care time: 35 minutes    CRITICAL CARE Performed by: Leita SAUNDERS Mikaeel Petrow   Total critical care time: 35 minutes  Critical care time was exclusive of separately billable procedures and treating other patients.  Critical care was necessary to treat or prevent imminent or life-threatening deterioration.  Critical care was time spent personally by me on the following activities: development of treatment plan with patient and/or surrogate as well as nursing, discussions with consultants, evaluation of patient's response to treatment, examination of patient, obtaining history from patient or surrogate, ordering and performing treatments and interventions, ordering and review of laboratory studies, ordering and review of radiographic studies, pulse oximetry and re-evaluation of patient's condition.  Leita SAUNDERS Harrington Jobe, PA-C [facility] See Amion for pager If no response to pager , please call [phone] until 7pm After 7:00 pm call Elink  [phone]

## 2023-12-02 NOTE — Progress Notes (Signed)
 SLP Cancellation Note  Patient Details Name: Heather Holt MRN: [id number] DOB: November 12, 1930   Cancelled treatment:       Reason Eval/Treat Not Completed: Patient not medically ready. Pt on vent as of this am. Will f/u as able.     Leita SAILOR., M.A. CCC-SLP Acute Rehabilitation Services Office: [phone]  Secure chat preferred  12/02/2023, 8:01 AM

## 2023-12-02 NOTE — Progress Notes (Signed)
 Pt transported on the ventilator from 4N26 to MRI 1 and back without complication. RT and RN accompanied pt.

## 2023-12-02 NOTE — Progress Notes (Signed)
 [facility] ADULT ICU REPLACEMENT PROTOCOL   The patient does apply for the [facility] Adult ICU Electrolyte Replacment Protocol based on the criteria listed below:   1.Exclusion criteria: TCTS, ECMO, Dialysis, and Myasthenia Gravis patients 2. Is GFR >/= 30 ml/min? Yes.    Patient's GFR today is 45 3. Is SCr </= 2? Yes.   Patient's SCr is 1.15 mg/dL 4. Did SCr increase >/= 0.5 in 24 hours? No. 5.Pt's weight >40kg  Yes.   6. Abnormal electrolyte(s): K+ 3.1, Mag 1.8  7. Electrolytes replaced per protocol 8.  Call MD STAT for K+ </= 2.5, Phos </= 1, or Mag </= 1 Physician:  Rada Henry Recardo LELON 12/02/2023 1:28 AM

## 2023-12-02 NOTE — Telephone Encounter (Signed)
 Patient Product/process development scientist completed.    The patient is insured through [facility]. Patient has Medicare and is not eligible for a copay card, but may be able to apply for patient assistance or Medicare RX Payment Plan (Patient Must reach out to their plan, if eligible for payment plan), if available.    Ran test claim for Eliquis 5 mg and the current 30 day co-pay is $302.00 due to a $255.00 deductible.  Will be $47.00 once deductible is met.   This test claim was processed through [facility]- copay amounts may vary at other pharmacies due to pharmacy/plan contracts, or as the patient moves through the different stages of their insurance plan.     Reyes Sharps, CPHT Pharmacy Technician Patient Advocate Specialist [facility] Patient Advocate Team Direct Number: [phone]  Fax: [phone]

## 2023-12-02 NOTE — TOC CAGE-AID Note (Signed)
 Transition of Care [facility]) - CAGE-AID Screening   Patient Details  Name: Heather Holt MRN: [id number] Date of Birth: 04-26-1930  [facility]) CM/SW Contact:    Shiree Altemus E Lily Kernen, LCSW Phone Number: 12/02/2023, 9:09 AM   Clinical Narrative: Patient currently intubated.   CAGE-AID Screening: Substance Abuse Screening unable to be completed due to: : Patient unable to participate

## 2023-12-02 NOTE — Progress Notes (Signed)
 OT Cancellation Note  Patient Details Name: Heather Holt MRN: [id number] DOB: Jan 26, 1931   Cancelled Treatment:    Reason Eval/Treat Not Completed: Patient not medically ready (Per RN, pt is not appropriate for therapy evaluations today.)  [name] 12/02/2023, 9:29 AM

## 2023-12-03 ENCOUNTER — Inpatient Hospital Stay (HOSPITAL_COMMUNITY)

## 2023-12-03 DIAGNOSIS — I4891 Unspecified atrial fibrillation: Secondary | ICD-10-CM | POA: Diagnosis not present

## 2023-12-03 DIAGNOSIS — G936 Cerebral edema: Secondary | ICD-10-CM | POA: Diagnosis not present

## 2023-12-03 DIAGNOSIS — I4819 Other persistent atrial fibrillation: Secondary | ICD-10-CM | POA: Diagnosis not present

## 2023-12-03 DIAGNOSIS — K219 Gastro-esophageal reflux disease without esophagitis: Secondary | ICD-10-CM | POA: Diagnosis not present

## 2023-12-03 DIAGNOSIS — I63411 Cerebral infarction due to embolism of right middle cerebral artery: Secondary | ICD-10-CM | POA: Diagnosis not present

## 2023-12-03 DIAGNOSIS — R233 Spontaneous ecchymoses: Secondary | ICD-10-CM | POA: Diagnosis not present

## 2023-12-03 DIAGNOSIS — I63511 Cerebral infarction due to unspecified occlusion or stenosis of right middle cerebral artery: Secondary | ICD-10-CM | POA: Diagnosis not present

## 2023-12-03 DIAGNOSIS — I10 Essential (primary) hypertension: Secondary | ICD-10-CM | POA: Diagnosis not present

## 2023-12-03 LAB — OSMOLALITY, URINE: Osmolality, Ur: 353 mosm/kg (ref 300–900)

## 2023-12-03 LAB — BASIC METABOLIC PANEL WITH GFR
Anion gap: 10 (ref 5–15)
Anion gap: 14 (ref 5–15)
BUN: 18 mg/dL (ref 8–23)
BUN: 21 mg/dL (ref 8–23)
CO2: 22 mmol/L (ref 22–32)
CO2: 22 mmol/L (ref 22–32)
Calcium: 8.9 mg/dL (ref 8.9–10.3)
Calcium: 9.2 mg/dL (ref 8.9–10.3)
Chloride: 109 mmol/L (ref 98–111)
Chloride: 102 mmol/L (ref 98–111)
Creatinine, Ser: 1.44 mg/dL — ABNORMAL HIGH (ref 0.44–1.00)
Creatinine, Ser: 1.27 mg/dL — ABNORMAL HIGH (ref 0.44–1.00)
GFR, Estimated: 34 mL/min — ABNORMAL LOW (ref >60)
GFR, Estimated: 40 mL/min — ABNORMAL LOW (ref >60)
Glucose, Bld: 114 mg/dL — ABNORMAL HIGH (ref 70–99)
Glucose, Bld: 139 mg/dL — ABNORMAL HIGH (ref 70–99)
Potassium: 4.3 mmol/L (ref 3.5–5.1)
Potassium: 3.9 mmol/L (ref 3.5–5.1)
Sodium: 141 mmol/L (ref 135–145)
Sodium: 138 mmol/L (ref 135–145)

## 2023-12-03 LAB — SODIUM, URINE, RANDOM: Sodium, Ur: 108 mmol/L

## 2023-12-03 LAB — OSMOLALITY: Osmolality: 308 mosm/kg — ABNORMAL HIGH (ref 275–295)

## 2023-12-03 LAB — CBC
HCT: 34.5 % — ABNORMAL LOW (ref 36.0–46.0)
Hemoglobin: 10.9 g/dL — ABNORMAL LOW (ref 12.0–15.0)
MCH: 27.9 pg (ref 26.0–34.0)
MCHC: 31.6 g/dL (ref 30.0–36.0)
MCV: 88.2 fL (ref 80.0–100.0)
Platelets: 134 K/uL — ABNORMAL LOW (ref 150–400)
RBC: 3.91 MIL/uL (ref 3.87–5.11)
RDW: 15 % (ref 11.5–15.5)
WBC: 9.1 K/uL (ref 4.0–10.5)
nRBC: 0 % (ref 0.0–0.2)

## 2023-12-03 LAB — MAGNESIUM: Magnesium: 2.7 mg/dL — ABNORMAL HIGH (ref 1.7–2.4)

## 2023-12-03 MED ORDER — SENNOSIDES-DOCUSATE SODIUM 8.6-50 MG PO TABS
1.0000 | ORAL_TABLET | Freq: Two times a day (BID) | ORAL | Status: DC
  Administered 2023-12-03 – 2023-12-04 (×3): 1
  Filled 2023-12-03 (×4): qty 1

## 2023-12-03 MED ORDER — HYDRALAZINE HCL 50 MG PO TABS
100.0000 mg | ORAL_TABLET | Freq: Three times a day (TID) | ORAL | Status: DC
  Administered 2023-12-03 – 2023-12-04 (×4): 100 mg
  Filled 2023-12-03 (×6): qty 2

## 2023-12-03 MED ORDER — HYDRALAZINE HCL 50 MG PO TABS: 100.0000 mg | ORAL_TABLET | Freq: Three times a day (TID) | ORAL | Status: DC

## 2023-12-03 MED ORDER — SODIUM CHLORIDE 0.9 % IV SOLN: INTRAVENOUS | Status: DC

## 2023-12-03 MED ORDER — HEPARIN SODIUM (PORCINE) 5000 UNIT/ML IJ SOLN
5000.0000 [IU] | Freq: Three times a day (TID) | INTRAMUSCULAR | Status: DC
  Administered 2023-12-03 – 2023-12-07 (×12): 5000 [IU] via SUBCUTANEOUS
  Filled 2023-12-03 (×12): qty 1

## 2023-12-03 MED ORDER — LABETALOL HCL 5 MG/ML IV SOLN
10.0000 mg | INTRAVENOUS | Status: DC | PRN
  Administered 2023-12-04 – 2023-12-06 (×9): 10 mg via INTRAVENOUS
  Filled 2023-12-03 (×9): qty 4

## 2023-12-03 NOTE — Progress Notes (Signed)
 OT Cancellation Note  Patient Details Name: Heather Holt MRN: [id number] DOB: Feb 23, 1930   Cancelled Treatment:    Reason Eval/Treat Not Completed: Patient not medically ready (planning to extubate today, RN said to hold until then.)  [name] 12/03/2023, 8:44 AM

## 2023-12-03 NOTE — Progress Notes (Addendum)
   12/03/23 0807  Adult Ventilator Settings  Vent Type Servo i  Humidity HME  Vent Mode (S)  PSV;CPAP  FiO2 (%) 40 %  Pressure Support 10 cmH20  PEEP 5 cmH20   0909: PSV 8/+5  1122: PSV 5/+5

## 2023-12-03 NOTE — Progress Notes (Signed)
 NAME:  Heather Holt, MRN:  [id number], DOB:  [birth year], LOS: 2 ADMISSION DATE:  12/01/2023, CONSULTATION DATE: 12/01/2023 REFERRING MD: Elida Ross, MD, CHIEF COMPLAINT: Acute stroke  History of Present Illness:   89 year old with CKD, depression, A-fib, GERD, hypertension presenting with acute right MCA stroke.  Intubated for airway protection Status post IR thrombectomy and stent placement.  Pertinent  Medical History    has a past medical history of CKD (chronic kidney disease), Depression, GERD (gastroesophageal reflux disease), History of kidney stones, Hypertension, Osteoarthritis of lumbar spine, Primary localized osteoarthrosis of the knee, left, Radicular syndrome of left leg, and Vitamin D  deficiency.   Significant Hospital Events: Including procedures, antibiotic start and stop dates in addition to other pertinent events   10/12 IR thrombectomy and stent placement 10/13, waking up and moving the R side, remains intubated  10/14 MRI yesterday with petechial hemorrhage, weaning on vent this AM  Interim History / Subjective:  Repeat head CT with no acute changes Requiring Cleviprex  Tolerating SAT/SBT  Objective    Blood pressure (!) 160/77, pulse 64, temperature 98.5 F (36.9 C), temperature source Axillary, resp. rate 15, height 5' 9 (1.753 m), weight 80 kg, SpO2 100%.    Vent Mode: PSV;CPAP FiO2 (%):  [40 %] 40 % Set Rate:  [15 bmp-18 bmp] 15 bmp Vt Set:  [520 mL-530 mL] 530 mL PEEP:  [5 cmH20] 5 cmH20 Pressure Support:  [8 cmH20-10 cmH20] 10 cmH20 Plateau Pressure:  [15 cmH20-16 cmH20] 16 cmH20   Intake/Output Summary (Last 24 hours) at 12/03/2023 0845 Last data filed at 12/03/2023 0500 Gross per 24 hour  Intake 609.87 ml  Output 690 ml  Net -80.13 ml   Filed Weights   12/01/23 1133  Weight: 80 kg    General:  thin elderly F critically ill intubated and in NAD HEENT: MM pink/moist, ETT in place Neuro: PERRLA, moving the RUE and RLE to command,  nods to questions, not opening eyes, able to lift head off the bed, hemiplegic on L CV: s1s2 rrr, no m/r/g PULM:  mechanically ventilated, synchronous and clear bilaterally GI: soft, non-distended  Extremities: warm/dry, no edema     Labs: Creatinine 1.4 Platelets 134   Resolved problem list   Assessment and Plan  Acute right MCA stroke with superimposed IPH Status post thrombectomy -MRI with concern for petechial hemorrhage, repeat head CT done last night shows continued interval evolution of Right MCA territory infarct with superimposed intraparenchymal hemorrhage ,  -echocardiogram with EF 60-65%, mild LVH and dilation of the bilateral atria, possible small PFO -cleviprex to maintain SBP <180, oral medications resumed   Vent dependence due to stroke -SBT this AM -discussed re-intubation/trach with family, they would not want and do not think pt would want a trach.  If she is extubated and worsens would likely pursue comfort care   Atrial fibrillation -resume lopressor  -Not on anticoagulation as an outpatient -Telemetry monitoring -hold pradaxa in the setting of IPH  Hypertension -resume Coreg, resume Hydral -goal SBP <180  GERD -Start PPI  Critical care time: 40 minutes    CRITICAL CARE Performed by: Leita SAUNDERS Cecia Egge   Total critical care time: 40 minutes  Critical care time was exclusive of separately billable procedures and treating other patients.  Critical care was necessary to treat or prevent imminent or life-threatening deterioration.  Critical care was time spent personally by me on the following activities: development of treatment plan with patient and/or surrogate as well as nursing, discussions with  consultants, evaluation of patient's response to treatment, examination of patient, obtaining history from patient or surrogate, ordering and performing treatments and interventions, ordering and review of laboratory studies, ordering and review of  radiographic studies, pulse oximetry and re-evaluation of patient's condition.  Leita SAUNDERS Rudie Rikard, PA-C [facility] See Amion for pager If no response to pager , please call [phone] until 7pm After 7:00 pm call Elink  [phone]

## 2023-12-03 NOTE — TOC CM/SW Note (Signed)
 Transition of Care [facility]) - Inpatient Brief Assessment   Patient Details  Name: Nanna Ertle MRN: [id number] Date of Birth: 03-07-1930  [facility]) CM/SW Contact:    Meliya Mcconahy M, RN Phone Number: 12/03/2023, 5:32 PM   Clinical Narrative: Shantika Bermea is a 89 y.o. female with a hx of CKD, depression, persistent afib, GERD, HTN, osteoarthritis, vitamin D  deficiency presenting with left sided weakness, right gaze, dysarthria. CT with right MCA territory infarct.  Patient remains intubated; will follow as patient progresses.    Transition of Care Asessment: Insurance and Status: Insurance coverage has been reviewed Patient has primary care physician: Yes Darrel Cristal) Home environment has been reviewed: lives alone Prior level of function:: independent Prior/Current Home Services: No current home services Social Drivers of Health Review: SDOH reviewed no interventions necessary Readmission risk has been reviewed: Yes Transition of care needs: transition of care needs identified, TOC will continue to follow . Mliss MICAEL Fass, RN, BSN  Trauma/Neuro ICU Case Manager [phone]

## 2023-12-03 NOTE — Progress Notes (Addendum)
 STROKE TEAM PROGRESS NOTE    SIGNIFICANT HOSPITAL EVENTS 10/12- Code stroke, IR   INTERIM HISTORY/SUBJECTIVE Patient remains afebrile but continues to require Cleviprex to keep blood pressure within parameters.  CCM team is hoping to extubate patient pending discussion with family about plans for reintubation.  Head CT overnight demonstrates evolving right MCA territory infarct with superimposed IPH stable compared to previous MRI.  OBJECTIVE  CBC    Component Value Date/Time   WBC 9.1 12/03/2023 0259   RBC 3.91 12/03/2023 0259   HGB 10.9 (L) 12/03/2023 0259   HCT 34.5 (L) 12/03/2023 0259   PLT 134 (L) 12/03/2023 0259   MCV 88.2 12/03/2023 0259   MCH 27.9 12/03/2023 0259   MCHC 31.6 12/03/2023 0259   RDW 15.0 12/03/2023 0259   LYMPHSABS 0.6 (L) 12/01/2023 1128   MONOABS 0.2 12/01/2023 1128   EOSABS 0.0 12/01/2023 1128   BASOSABS 0.0 12/01/2023 1128    BMET    Component Value Date/Time   NA 141 12/03/2023 0259   K 4.3 12/03/2023 0259   CL 109 12/03/2023 0259   CO2 22 12/03/2023 0259   GLUCOSE 114 (H) 12/03/2023 0259   BUN 18 12/03/2023 0259   CREATININE 1.44 (H) 12/03/2023 0259   CALCIUM 8.9 12/03/2023 0259   GFRNONAA 34 (L) 12/03/2023 0259    IMAGING past 24 hours DG CHEST PORT 1 VIEW Result Date: 12/03/2023 EXAM: 1 VIEW(S) XRAY OF THE CHEST 12/03/2023 10:16:00 AM COMPARISON: None available. CLINICAL HISTORY: History of ETT. Reason for exam: history of ETT. FINDINGS: LINES, TUBES AND DEVICES: Endotracheal tube and grossly good position nasogastric tube seen in each stomach. LUNGS AND PLEURA: Right upper lobe nodular density is noted. CT scan is recommended. No pulmonary edema. No pleural effusion. No pneumothorax. HEART AND MEDIASTINUM: Mild cardiomegaly. BONES AND SOFT TISSUES: No acute osseous abnormality. IMPRESSION: 1. Right upper lobe pulmonary nodule. Recommend chest CT for further characterization. 2. Mild cardiomegaly. Electronically signed by: Lynwood Seip MD  12/03/2023 10:48 AM EDT RP Workstation: [id number]   CT HEAD POST STROKE FOLLOWUP/TIMED/STAT READ Result Date: 12/03/2023 EXAM: CT HEAD WITHOUT 12/03/2023 03:16:34 AM TECHNIQUE: CT of the head was performed without the administration of intravenous contrast. Automated exposure control, iterative reconstruction, and/or weight based adjustment of the mA/kV was utilized to reduce the radiation dose to as low as reasonably achievable. COMPARISON: Prior MRI. CLINICAL HISTORY: Neuro deficit, acute, stroke suspected. Neuro deficit, acute, stroke suspected. Neuro deficit, acute, stroke suspected. Neuro deficit, acute, stroke suspected. FINDINGS: BRAIN AND VENTRICLES: Continued interval evolution of previously identified right MCA distribution infarct, stable in size and distribution from prior. Superimposed areas of hemorrhage also similar to prior MRI. Largest focus of hemorrhage seen at the right lentiform nucleus and measures up to 3.5 cm. Regional mass effect with partial effacement of the right lateral ventricle but no significant midline shift. Basilar cisterns remain patent. No extra-axial fluid collection. No hydrocephalus. No other new acute intracranial abnormality. Underlying moderate chronic microvascular ischemic disease noted. ORBITS: No acute abnormality. SINUSES AND MASTOIDS: Associated trace right mastoid effusion. SOFT TISSUES AND SKULL: Lipoma noted at the left frontal scalp. Patient is intubated with endotracheal and enteric tubes partially visualized. No acute skull fracture. IMPRESSION: 1. Continued interval evolution of Right MCA territory infarct with superimposed intraparenchymal hemorrhage, overall relatively similar as compared to previous MRI. 2. Regional mass effect with partial effacement of the right lateral ventricle but no significant midline shift at this time. 3. No other new acute intracranial abnormality. Electronically signed  by: Morene Hoard MD 12/03/2023 03:32 AM EDT RP  Workstation: [id number]   DG Abd Portable 1V Result Date: 12/02/2023 EXAM: 1 VIEW XRAY OF THE ABDOMEN 12/02/2023 06:45:31 PM COMPARISON: CT abdomen and pelvis 10/04/2016. CLINICAL HISTORY: Encounter for orogastric (OG) tube placement. FINDINGS: LINES, TUBES AND DEVICES: Enteric tube coursing below the diaphragm with distal tip and side port terminating within the expected location of the stomach. BOWEL: Nonobstructive bowel gas pattern. SOFT TISSUES: No opaque urinary calculi. BONES: No acute osseous abnormality. VASCULATURE: Atherosclerotic calcifications of the aorta. IMPRESSION: 1. Enteric tube in appropriate position with distal tip and side port terminating within the expected location of the stomach. Electronically signed by: Donnice Mania MD 12/02/2023 09:09 PM EDT RP Workstation: [id number]    Vitals:   12/03/23 1200 12/03/23 1300 12/03/23 1400 12/03/23 1403  BP: (!) 194/105 (!) 142/71 127/71   Pulse: 75 80 87   Resp: (!) 21 19 19    Temp:      TempSrc:      SpO2: 100% 100% 100% 100%  Weight:      Height:          Physical Exam  Constitutional: NAD  Psych: sedation off Eyes: No scleral injection.  HENT: ETT in place Head: Normocephalic.  Cardiovascular: Normal rate and regular rhythm.  Respiratory: Respirations synchronous with ventilator Skin: WDI.   Neuro: Mental Status: Patient opens eyes to voice and follows simple commands with right upper and lower extremities Cranial Nerves: Pupils equal round and reactive to light, right gaze deviation, will move right upper and lower extremities to command with antigravity strength in the right upper extremity, withdraws left upper extremity to noxious, triple flexion to noxious noted in left lower extremity.    ASSESSMENT/PLAN  Ms. Teena Mangus is a 89 y.o. female with history of CKD, depression, persistent afib, GERD, HTN, osteoarthritis, vitamin D  deficiency presenting as a code stroke via EMS.  NIH on Admission  25  Stroke:  right large MCA territory infarct with confluent petechial hemorrhage due to right M1 occlusion s/p IR with TICI2b, etiology: Likely Afib not on AC   CT Head Acute right MCA territory infarct.  ASPECTS 6/10 No hyperdense vessel. Atherosclerotic calcifications in the cavernous carotid arteries bilaterally.  CT angio Head and Neck Distal right M1 occlusion with poor collateralization of the anterior and superior divisions. CTP core infarct of 13 ml and larger area of ischemia in the right MCA territory measuring 94 ml.  However core infarct was underestimated due to pseudonormalization MRI  Acute large infarct in the right MCA territory with associated edema and slightly increased mass effect on the right lateral ventricle without significant midline shift. Heterogeneous areas of susceptibility in the right MCA territory concerning for petechial hemorrhage.  MRA  Interval recanalization of the M1 segment of the right MCA compared to the recent prior CTA. The MCA and distal branches appear patent without evidence of high-grade stenosis. CT repeat 10/14 continued evolution of right MCA territory infarct with stable superimposed IPH, mass effect but no significant MLS 2D Echo 60 to 65%, LA severely dilated  LDL 61 HgbA1c 5.5 VTE prophylaxis - SCDs No antithrombotic prior to admission, now antithrombotics on hold due to confluent petechial hemorrhage. Will consider ASA in a couple of days Therapy recommendations:  Pending Disposition: Pending, palliative care consulted, son leaning toward no re-intubation and no trach  Atrial fibrillation Home Meds: prior auth for pradaxa was denied, does not appear to have been taking any other anticoagulation.  Continue telemetry monitoring Rate controlled so far  Respiratory insuffiency post procedure  CCM following Intubated on vent On weaning today Plan is for extubation today and no re-intubation  Hypertension Home meds:  Coreg 25mg ,  hydralazine  100 mg 3 times daily, furosemide 20 mg daily Stable now with low dose cleviprex Blood Pressure Goal: SBP less than 160  Long-term BP goal normotensive  Hyperlipidemia Home meds: Atorvastatin 20mg , resumed in hospital LDL 61, goal < 70 Continue statin at discharge  Dysphagia  Post stroke dysphagia Now NPO Speech on board Hold off TF given possible extubation today On gentle IVF  Other stroke risk factors Advanced age  Other active problems CKD Stage 3a, Cr 1.4-1.15-1.44-1.27, GFR 45 Hypokalemia, Replaced GERD, on PPI   Hospital day # 2  Patient seen and examined by NP/APP with MD. MD to update note as needed.   Cortney E Everitt Clint Kill , MSN, AGACNP-BC [facility] See Amion for schedule and pager information 12/03/2023 2:19 PM   ATTENDING NOTE: I reviewed above note and agree with the assessment and plan. Pt was seen and examined.   Son, grandson and daughter in law are at the bedside. Pt is intubated off sedation, eyes closed but open on voice, able to follow simple commands on the R hand and foot. With forced eye opening, eyes in R gazse position not able to cross midline, intermittent blinking to visual threat on the right but not on the left, not active tracking, bilateral pupils are small and sluggish to light. Corneal reflex present bilaterally stronger on the R, gag and cough present. Breathing over the vent.  Facial symmetry not able to test due to ET tube.  Tongue protrusion not cooperative. On pain stimulation, slight withdraw of LUE and LLE, spontaneous movement of RUE and RLE against gravity. Left babinski positive. Sensation, coordination not cooperative and gait not tested.   For detailed assessment and plan, please refer to above as I have made changes wherever appropriate.   [name], MD PhD Stroke Neurology 12/03/2023 4:16 PM  This patient is critically ill due to large right MCA stroke, acute petechial hemorrhage, status post  thrombectomy, cerebral edema, respiratory failure, A-fib not on AC and at significant risk of neurological worsening, death form recurrent stroke, hemorrhagic termination, brain herniation, heart failure, seizure. This patient's care requires constant monitoring of vital signs, hemodynamics, respiratory and cardiac monitoring, review of multiple databases, neurological assessment, discussion with family, other specialists and medical decision making of high complexity. I spent 35 minutes of neurocritical care time in the care of this patient. I had long discussion with son and daughter-in-law at bedside, updated pt current condition, treatment plan and potential prognosis, and answered all the questions.  They expressed understanding and appreciation.    To contact Stroke Continuity provider, please refer to [email or web address]. After hours, contact General Neurology

## 2023-12-03 NOTE — Progress Notes (Signed)
 PT Cancellation Note  Patient Details Name: Heather Holt MRN: [id number] DOB: 11-Aug-1930   Cancelled Treatment:    Reason Eval/Treat Not Completed: Patient not medically ready  Remains intubated with goal to try to extubate today.    Macario RAMAN, PT Acute Rehabilitation Services  Office [phone]   Macario SHAUNNA Soja 12/03/2023, 12:40 PM

## 2023-12-03 NOTE — Progress Notes (Signed)
 SLP Cancellation Note  Patient Details Name: Heather Holt MRN: [id number] DOB: [birth year]   Cancelled treatment:       Reason Eval/Treat Not Completed: Patient not medically ready. Pt on the vent this am, working towards hopeful extubation today. Will f/u post-extubation as able.     Leita SAILOR., M.A. CCC-SLP Acute Rehabilitation Services Office: [phone]  Secure chat preferred  12/03/2023, 10:20 AM

## 2023-12-03 NOTE — Progress Notes (Signed)
 PCCM interval progress note:  Pt weaned well today on PSV 5/5, however became more somnolent, so held off extubation.  She also dumped 1.5L clear urine, urine osms and Na ordered.    Leita SAUNDERS Jahfari Ambers, PA-C

## 2023-12-03 NOTE — Plan of Care (Signed)
 Overnight neurology note  CT head completed.  Compared to prior MRI, no significant change. Plan as per stroke neurology team Stroke neurology team to round in the morning.

## 2023-12-03 NOTE — Progress Notes (Signed)
Pt transported from 4N26 to CT and back with no complications.

## 2023-12-04 DIAGNOSIS — I480 Paroxysmal atrial fibrillation: Secondary | ICD-10-CM

## 2023-12-04 DIAGNOSIS — I63411 Cerebral infarction due to embolism of right middle cerebral artery: Secondary | ICD-10-CM | POA: Diagnosis not present

## 2023-12-04 DIAGNOSIS — I10 Essential (primary) hypertension: Secondary | ICD-10-CM | POA: Diagnosis not present

## 2023-12-04 DIAGNOSIS — I69391 Dysphagia following cerebral infarction: Secondary | ICD-10-CM

## 2023-12-04 DIAGNOSIS — K219 Gastro-esophageal reflux disease without esophagitis: Secondary | ICD-10-CM | POA: Diagnosis not present

## 2023-12-04 DIAGNOSIS — Z9911 Dependence on respirator [ventilator] status: Secondary | ICD-10-CM

## 2023-12-04 DIAGNOSIS — G936 Cerebral edema: Secondary | ICD-10-CM | POA: Diagnosis not present

## 2023-12-04 DIAGNOSIS — R233 Spontaneous ecchymoses: Secondary | ICD-10-CM | POA: Diagnosis not present

## 2023-12-04 DIAGNOSIS — I4819 Other persistent atrial fibrillation: Secondary | ICD-10-CM | POA: Diagnosis not present

## 2023-12-04 DIAGNOSIS — I4891 Unspecified atrial fibrillation: Secondary | ICD-10-CM | POA: Diagnosis not present

## 2023-12-04 DIAGNOSIS — I63511 Cerebral infarction due to unspecified occlusion or stenosis of right middle cerebral artery: Secondary | ICD-10-CM | POA: Diagnosis not present

## 2023-12-04 LAB — CBC
HCT: 38.2 % (ref 36.0–46.0)
Hemoglobin: 12.3 g/dL (ref 12.0–15.0)
MCH: 28 pg (ref 26.0–34.0)
MCHC: 32.2 g/dL (ref 30.0–36.0)
MCV: 87 fL (ref 80.0–100.0)
Platelets: 162 K/uL (ref 150–400)
RBC: 4.39 MIL/uL (ref 3.87–5.11)
RDW: 15 % (ref 11.5–15.5)
WBC: 8.8 K/uL (ref 4.0–10.5)
nRBC: 0 % (ref 0.0–0.2)

## 2023-12-04 LAB — GLUCOSE, CAPILLARY: Glucose-Capillary: 103 mg/dL — ABNORMAL HIGH (ref 70–99)

## 2023-12-04 LAB — BASIC METABOLIC PANEL WITH GFR
Anion gap: 12 (ref 5–15)
BUN: 24 mg/dL — ABNORMAL HIGH (ref 8–23)
CO2: 22 mmol/L (ref 22–32)
Calcium: 9 mg/dL (ref 8.9–10.3)
Chloride: 106 mmol/L (ref 98–111)
Creatinine, Ser: 1.3 mg/dL — ABNORMAL HIGH (ref 0.44–1.00)
GFR, Estimated: 39 mL/min — ABNORMAL LOW (ref >60)
Glucose, Bld: 124 mg/dL — ABNORMAL HIGH (ref 70–99)
Potassium: 3.8 mmol/L (ref 3.5–5.1)
Sodium: 140 mmol/L (ref 135–145)

## 2023-12-04 LAB — MAGNESIUM: Magnesium: 2.2 mg/dL (ref 1.7–2.4)

## 2023-12-04 MED ORDER — HYDRALAZINE HCL 20 MG/ML IJ SOLN
10.0000 mg | INTRAMUSCULAR | Status: DC | PRN
  Administered 2023-12-04 – 2023-12-07 (×9): 10 mg via INTRAVENOUS
  Filled 2023-12-04 (×9): qty 1

## 2023-12-04 MED ORDER — CARVEDILOL 12.5 MG PO TABS
25.0000 mg | ORAL_TABLET | Freq: Two times a day (BID) | ORAL | Status: DC
  Filled 2023-12-04: qty 2

## 2023-12-04 MED ORDER — POLYETHYLENE GLYCOL 3350 17 G PO PACK
17.0000 g | PACK | Freq: Two times a day (BID) | ORAL | Status: DC
  Administered 2023-12-04: 17 g
  Filled 2023-12-04 (×2): qty 1

## 2023-12-04 NOTE — Progress Notes (Incomplete)
 Attending note: I have seen and examined the patient. History, labs and imaging reviewed.  More awake  Blood pressure 133/71, pulse 81, temperature (!) 97.4 F (36.3 C), temperature source Axillary, resp. rate 15, height 5' 9 (1.753 m), weight 80 kg, SpO2 100%. Gen:      No acute distress HEENT:  EOMI, sclera anicteric Neck:     No masses; no thyromegaly Lungs:    Clear to auscultation bilaterally; normal respiratory effort*** CV:         Regular rate and rhythm; no murmurs Abd:      + bowel sounds; soft, non-tender; no palpable masses, no distension Ext:    No edema; adequate peripheral perfusion Neuro: alert and oriented x 3 Psych: normal mood and affect   Labs/Imaging personally reviewed, significant for   Assessment/plan:   Plan for extubation after clarifying goals of care for reintubation  The patient is critically ill with multiple organ systems failure and requires high complexity decision making for assessment and support, frequent evaluation and titration of therapies, application of advanced monitoring technologies and extensive interpretation of multiple databases.  Critical care time - 35 mins. This represents my time independent of the NPs time taking care of the pt.  Mauria Asquith MD [facility] 12/04/2023, 9:40 AM

## 2023-12-04 NOTE — Progress Notes (Signed)
 OT Cancellation Note  Patient Details Name: Heather Holt MRN: [id number] DOB: [birth year]   Cancelled Treatment:    Reason Eval/Treat Not Completed: Patient not medically ready RN reports plan for potential one way extubation today, pending how pt does will plan to follow-up another day if pt does not go comfort care.   Etta NOVAK, OT Acute Rehabilitation Services Office [phone] Secure Chat Preferred    Etta GORMAN Hope 12/04/2023, 9:45 AM

## 2023-12-04 NOTE — Progress Notes (Addendum)
 STROKE TEAM PROGRESS NOTE    SIGNIFICANT HOSPITAL EVENTS 10/12- Code stroke, IR   INTERIM HISTORY/SUBJECTIVE Patient remains afebrile but continues to require Cleviprex to keep blood pressure within parameters.  Discussion was had with family and CCM team, and decision was made to proceed with one-way extubation.  OBJECTIVE  CBC    Component Value Date/Time   WBC 8.8 12/04/2023 0858   RBC 4.39 12/04/2023 0858   HGB 12.3 12/04/2023 0858   HCT 38.2 12/04/2023 0858   PLT 162 12/04/2023 0858   MCV 87.0 12/04/2023 0858   MCH 28.0 12/04/2023 0858   MCHC 32.2 12/04/2023 0858   RDW 15.0 12/04/2023 0858   LYMPHSABS 0.6 (L) 12/01/2023 1128   MONOABS 0.2 12/01/2023 1128   EOSABS 0.0 12/01/2023 1128   BASOSABS 0.0 12/01/2023 1128    BMET    Component Value Date/Time   NA 140 12/04/2023 0858   K 3.8 12/04/2023 0858   CL 106 12/04/2023 0858   CO2 22 12/04/2023 0858   GLUCOSE 124 (H) 12/04/2023 0858   BUN 24 (H) 12/04/2023 0858   CREATININE 1.30 (H) 12/04/2023 0858   CALCIUM 9.0 12/04/2023 0858   GFRNONAA 39 (L) 12/04/2023 0858    IMAGING past 24 hours No results found.   Vitals:   12/04/23 1000 12/04/23 1009 12/04/23 1012 12/04/23 1151  BP: (!) 173/85 (!) 173/85 (!) 173/85   Pulse:  92 85   Resp:  (!) 21 20   Temp:    98.5 F (36.9 C)  TempSrc:    Axillary  SpO2:  100% 100%   Weight:      Height:          Physical Exam  Constitutional: NAD  Psych: sedation off Eyes: No scleral injection.  HENT: ETT in place Head: Normocephalic.  Cardiovascular: Normal rate and regular rhythm.  Respiratory: Respirations synchronous with ventilator Skin: WDI.   Neuro: Mental Status: Patient opens eyes to voice and touch and follows simple commands with right upper and lower extremities Cranial Nerves: Pupils equal round and reactive to light, right gaze deviation but able to cross midline today, will move right upper and lower extremities to command with antigravity  strength in the right upper extremity, no movement of left upper extremity, triple flexion to noxious noted in left lower extremity.    ASSESSMENT/PLAN  Ms. Octavie Westerhold is a 89 y.o. female with history of CKD, depression, persistent afib, GERD, HTN, osteoarthritis, vitamin D  deficiency presenting as a code stroke via EMS.  NIH on Admission 25  Stroke:  right large MCA territory infarct with confluent petechial hemorrhage due to right M1 occlusion s/p IR with TICI2b, etiology: Likely Afib not on AC   CT Head Acute right MCA territory infarct.  ASPECTS 6/10 No hyperdense vessel. Atherosclerotic calcifications in the cavernous carotid arteries bilaterally.  CT angio Head and Neck Distal right M1 occlusion with poor collateralization of the anterior and superior divisions. CTP core infarct of 13 ml and larger area of ischemia in the right MCA territory measuring 94 ml.  However core infarct was underestimated due to pseudonormalization MRI  Acute large infarct in the right MCA territory with associated edema and slightly increased mass effect on the right lateral ventricle without significant midline shift. Heterogeneous areas of susceptibility in the right MCA territory concerning for petechial hemorrhage.  MRA  Interval recanalization of the M1 segment of the right MCA compared to the recent prior CTA. The MCA and distal branches appear patent without  evidence of high-grade stenosis. CT repeat 10/14 continued evolution of right MCA territory infarct with stable superimposed IPH, mass effect but no significant MLS 2D Echo 60 to 65%, LA severely dilated  LDL 61 HgbA1c 5.5 VTE prophylaxis - SCDs No antithrombotic prior to admission, now antithrombotics on hold due to confluent petechial hemorrhage. Will consider ASA in am if neuro stable Therapy recommendations:  Pending Disposition: Pending, palliative care consulted  Atrial fibrillation Home Meds: prior auth for pradaxa was denied, does not  appear to have been taking any other anticoagulation.  Continue telemetry monitoring Rate controlled so far  Respiratory insuffiency post procedure  CCM following Remain Intubated post procedure One-way extubation today  Tolerating well so far  Hypertension Home meds:  Coreg 25mg , hydralazine  100 mg 3 times daily, furosemide 20 mg daily Stable now off cleviprex Blood Pressure Goal: SBP less than 160  Long-term BP goal normotensive  Hyperlipidemia Home meds: Atorvastatin 20mg  LDL 61, goal < 70 Resume lipitor once po access Continue statin at discharge  Dysphagia  Post stroke dysphagia Now NPO Speech on board On gentle IVF Will consider TF if needed tomorrow  Other stroke risk factors Advanced age  Other active problems CKD Stage 3a, Cr 1.4-1.15-1.44-1.27-1.30, GFR 45 Hypokalemia, Replaced GERD, on PPI   Hospital day # 3  Patient seen and examined by NP/APP with MD. MD to update note as needed.   Cortney E Everitt Clint Kill , MSN, AGACNP-BC [facility] See Amion for schedule and pager information 12/04/2023 1:47 PM  ATTENDING NOTE: I reviewed above note and agree with the assessment and plan. Pt was seen and examined.   Son at the bedside. Pt lying in bed, lethargic but open eyes on voice, eyes right gaze preference but able to cross midline today. Still has left hemiplegia but right hand and foot following simple commands. Will do one-way extubation today by CCM. Will check swallow after extubation, if not passing swallow, may consider cortrak and TF.   For detailed assessment and plan, please refer to above as I have made changes wherever appropriate.   [name], MD PhD Stroke Neurology 12/04/2023 8:57 PM  This patient is critically ill due to large right MCA stroke, acute petechial hemorrhage, status post thrombectomy, cerebral edema, respiratory failure, A-fib not on AC and at significant risk of neurological worsening, death form recurrent stroke,  hemorrhagic termination, brain herniation, heart failure, seizure. This patient's care requires constant monitoring of vital signs, hemodynamics, respiratory and cardiac monitoring, review of multiple databases, neurological assessment, discussion with family, other specialists and medical decision making of high complexity. I spent 35 minutes of neurocritical care time in the care of this patient. I had long discussion with son and daughter-in-law at bedside, updated pt current condition, treatment plan and potential prognosis, and answered all the questions.  They expressed understanding and appreciation.   To contact Stroke Continuity provider, please refer to [email or web address]. After hours, contact General Neurology

## 2023-12-04 NOTE — Procedures (Signed)
 Extubation Procedure Note  Patient Details:   Name: Heather Holt DOB: Mar 28, 1930 MRN: [id number]   Airway Documentation:    Vent end date: 12/04/23 Vent end time: 1143   Evaluation  O2 sats: stable throughout Complications: No apparent complications Patient did tolerate procedure well. Bilateral Breath Sounds: Clear, Diminished   No One way extubation  Shaylin Blatt 12/04/2023, 11:44 AM

## 2023-12-04 NOTE — IPAL (Signed)
  Interdisciplinary Goals of Care Family Meeting   Date carried out: 12/04/2023  Location of the meeting: Bedside  Member's involved: Family Member or next of kin and Other: Audiological scientist or acting medical decision maker: Heather Holt, son.     Discussion: We discussed goals of care for Heather Holt. We discussed that patient is doing well on spontaneous breathing trial and that she has a strong cough and gag. She is also more awake than yesterday and following commands briskly. For that reason the team thinks it is a good time to give her a trial of extubation. I re-iterated that given the acute CVA there is a risk that after extubation she would be unable to protect her airway and clear secretions. Son tells me that if this were to happen she would not want to be re-intubated and she definitely would not want to have a tracheostomy. Therefore code status was changed to DNR/DNI to reflect their wishes. We also discussed what comfort care would look like should she be unable to protect her airway. Plan for more family to arrive and then trial extubation.   Code status:   Code Status: Do not attempt resuscitation (DNR) PRE-ARREST INTERVENTIONS DESIRED   Disposition: Continue current acute care  Time spent for the meeting: 8 minutes    Rexene LOISE Blush, PA-C  12/04/2023, 10:08 AM

## 2023-12-04 NOTE — Progress Notes (Signed)
 NAME:  Heather Holt, MRN:  [id number], DOB:  [birth year], LOS: 3 ADMISSION DATE:  12/01/2023, CONSULTATION DATE: 12/01/2023 REFERRING MD: Elida Ross, MD, CHIEF COMPLAINT: Acute stroke  History of Present Illness:   89 year old with CKD, depression, A-fib, GERD, hypertension presenting with acute right MCA stroke.  Intubated for airway protection Status post IR thrombectomy and stent placement.  Pertinent  Medical History    has a past medical history of CKD (chronic kidney disease), Depression, GERD (gastroesophageal reflux disease), History of kidney stones, Hypertension, Osteoarthritis of lumbar spine, Primary localized osteoarthrosis of the knee, left, Radicular syndrome of left leg, and Vitamin D  deficiency.   Significant Hospital Events: Including procedures, antibiotic start and stop dates in addition to other pertinent events   10/12 IR thrombectomy and stent placement 10/13, waking up and moving the R side, remains intubated  10/14 MRI yesterday with petechial hemorrhage, weaning on vent this AM  Interim History / Subjective:  Did well on SBT yesterday but patient was too somnolent so held off on extubation. This morning remains somnolent but arousable, briskly follows commands on right side, has strong cough and gag.   Objective    Blood pressure 133/71, pulse 81, temperature (!) 97.4 F (36.3 C), temperature source Axillary, resp. rate 15, height 5' 9 (1.753 m), weight 80 kg, SpO2 100%.    Vent Mode: PSV;CPAP FiO2 (%):  [40 %] 40 % Set Rate:  [15 bmp] 15 bmp Vt Set:  [530 mL] 530 mL PEEP:  [5 cmH20] 5 cmH20 Pressure Support:  [5 cmH20] 5 cmH20 Plateau Pressure:  [14 cmH20-24 cmH20] 16 cmH20   Intake/Output Summary (Last 24 hours) at 12/04/2023 0913 Last data filed at 12/04/2023 0640 Gross per 24 hour  Intake 543.24 ml  Output 2300 ml  Net -1756.76 ml   Filed Weights   12/01/23 1133  Weight: 80 kg    General: elderly, chronically ill appearing female, on  mechanical ventilation  HEENT: sclera anicteric, MM pink/moist Neuro: opens eyes to commands, nods appropriately, somnolent, follows commands briskly on right side, flaccid LUE, minimal withdrawal LLE, +cough/gag CV: irregularly irregular, normal S1 and S2, no m/r/g PULM: mechanically ventilated on PS 5/5 breathing with rates in the 20s and VT in 300-400s, clear to auscultation  GI: soft, non-distended Extremities: warm/dry, no edema  Labs and images reviewed.   Resolved problem list   Assessment and Plan  Acute right MCA stroke with superimposed IPH Status post thrombectomy MRI with concern for petechial hemorrhage, repeat head CT 10/14 shows continued interval evolution of Right MCA territory infarct with superimposed intraparenchymal hemorrhage. Suspect etiology of CVA is Afib not on AC. - Goal SBP<180 per neurology - Antithrombotics on hold due to confluent petechial hemorrhage. Considering ASA in coming days - Continue statin   On mechanical ventilation: in the setting of acute CVA Did well on SBT yesterday but not extubated due to somnolence. Continues to do well on SBT this morning with strong cough/gag and seemingly more awake.  - Per discussion with family plan today will be trial of extubation without plan to re-intubate. Patient now DNR/DNI, see separate IPAL note  Atrial fibrillation: currently rate controlled 90-100's. Not on [facility] as outpatient.  - Continue coreg  - Continue tele monitoring  - Hold pradaxa in the setting of IPH  Hypertension - Continue cleviprex to maintain SBP <180, will utilize PRN labetalol and to wean cleviprex - Will increase coreg to home dosing to 25 mg BID - Continue home hydralazine   GERD -  Continue PPI  At risk for dysphagia: in the setting of acute CVA - Will wean to extubate per above  - SLP consulted - If doing well extubated suspect she may need Cortrak placement which family are agreeable to      The patient is critically ill with  multiple organ system failure and requires high complexity decision making for assessment and support, frequent evaluation and titration of therapies, advanced monitoring, review of radiographic studies and interpretation of complex data.   Critical Care Time devoted to patient care services, exclusive of separately billable procedures, described in this note is 35 minutes.  Rexene LOISE Blush, PA-C [facility] 12/04/23 10:54 AM  Please see [email or web address] for pager details.  From 7A-7P if no response, please call [phone] After hours, please call ELink [phone]

## 2023-12-04 NOTE — Progress Notes (Signed)
 PT Cancellation Note  Patient Details Name: Heather Holt MRN: [id number] DOB: 08-28-1930   Cancelled Treatment:    Reason Eval/Treat Not Completed: (P) Patient not medically ready. RN reports plan for potential one way extubation today, pending how pt does will plan to follow-up another day if pt does not go comfort care.   Theo Ferretti, PT, DPT Acute Rehabilitation Services  Office: [phone]    Theo CHRISTELLA Ferretti 12/04/2023, 8:53 AM

## 2023-12-05 ENCOUNTER — Other Ambulatory Visit (HOSPITAL_COMMUNITY): Payer: Self-pay

## 2023-12-05 DIAGNOSIS — I4819 Other persistent atrial fibrillation: Secondary | ICD-10-CM | POA: Diagnosis not present

## 2023-12-05 DIAGNOSIS — R233 Spontaneous ecchymoses: Secondary | ICD-10-CM | POA: Diagnosis not present

## 2023-12-05 DIAGNOSIS — Z66 Do not resuscitate: Secondary | ICD-10-CM

## 2023-12-05 DIAGNOSIS — I63511 Cerebral infarction due to unspecified occlusion or stenosis of right middle cerebral artery: Secondary | ICD-10-CM | POA: Diagnosis not present

## 2023-12-05 DIAGNOSIS — Z7189 Other specified counseling: Secondary | ICD-10-CM | POA: Diagnosis not present

## 2023-12-05 DIAGNOSIS — I639 Cerebral infarction, unspecified: Secondary | ICD-10-CM

## 2023-12-05 DIAGNOSIS — R1310 Dysphagia, unspecified: Secondary | ICD-10-CM

## 2023-12-05 DIAGNOSIS — Z515 Encounter for palliative care: Secondary | ICD-10-CM | POA: Diagnosis not present

## 2023-12-05 DIAGNOSIS — G936 Cerebral edema: Secondary | ICD-10-CM | POA: Diagnosis not present

## 2023-12-05 LAB — MAGNESIUM: Magnesium: 2.3 mg/dL (ref 1.7–2.4)

## 2023-12-05 LAB — BASIC METABOLIC PANEL WITH GFR
Anion gap: 13 (ref 5–15)
BUN: 23 mg/dL (ref 8–23)
CO2: 23 mmol/L (ref 22–32)
Calcium: 9.2 mg/dL (ref 8.9–10.3)
Chloride: 107 mmol/L (ref 98–111)
Creatinine, Ser: 1.1 mg/dL — ABNORMAL HIGH (ref 0.44–1.00)
GFR, Estimated: 47 mL/min — ABNORMAL LOW (ref >60)
Glucose, Bld: 105 mg/dL — ABNORMAL HIGH (ref 70–99)
Potassium: 3.6 mmol/L (ref 3.5–5.1)
Sodium: 143 mmol/L (ref 135–145)

## 2023-12-05 LAB — CBC
HCT: 39.9 % (ref 36.0–46.0)
Hemoglobin: 12.4 g/dL (ref 12.0–15.0)
MCH: 27.6 pg (ref 26.0–34.0)
MCHC: 31.1 g/dL (ref 30.0–36.0)
MCV: 88.9 fL (ref 80.0–100.0)
Platelets: 153 K/uL (ref 150–400)
RBC: 4.49 MIL/uL (ref 3.87–5.11)
RDW: 14.6 % (ref 11.5–15.5)
WBC: 8.1 K/uL (ref 4.0–10.5)
nRBC: 0 % (ref 0.0–0.2)

## 2023-12-05 LAB — PHOSPHORUS: Phosphorus: 4 mg/dL (ref 2.5–4.6)

## 2023-12-05 MED ORDER — POTASSIUM CHLORIDE 10 MEQ/100ML IV SOLN
10.0000 meq | INTRAVENOUS | Status: AC
  Administered 2023-12-05 (×4): 10 meq via INTRAVENOUS
  Filled 2023-12-05 (×4): qty 100

## 2023-12-05 MED ORDER — ORAL CARE MOUTH RINSE: 15.0000 mL | OROMUCOSAL | Status: DC | PRN

## 2023-12-05 MED ORDER — ASPIRIN 300 MG RE SUPP
300.0000 mg | Freq: Every day | RECTAL | Status: DC
  Administered 2023-12-05 – 2023-12-06 (×2): 300 mg via RECTAL
  Filled 2023-12-05 (×2): qty 1

## 2023-12-05 MED ORDER — ORAL CARE MOUTH RINSE
15.0000 mL | OROMUCOSAL | Status: DC
  Administered 2023-12-05 – 2023-12-09 (×13): 15 mL via OROMUCOSAL

## 2023-12-05 NOTE — Evaluation (Signed)
 Occupational Therapy Evaluation Patient Details Name: Heather Holt MRN: [id number] DOB: [birth year] Today's Date: 12/05/2023   History of Present Illness   Heather Holt is a 89 yo female who presented 10/12 after friends called for a well check. Pt was found on her bed with R gaze and L weakness. S/p R MCA thrombectomy 10/12. MRI showed R MCA infarct. ETT 10/12 - 10/15. PMHx: CKD, depression, persistent afib, GERD, HTN, osteoarthritis, vitamin D  deficiency     Clinical Impressions Heather Holt was evaluated s/p the above admission list. She lives alone and is mod I for mobility and ADLs at baseline. Upon evaluation the pt was limited by lethargy, L hemi weakness, impaired vision, poor balance and limited tolerance for activity. Overall she needed total A +2 for all aspects of mobility and ADLs. Once sitting she did maintain midline balance with CGA-mod A. At the end of the session, pt answered orientation questions accurately and gave yes/no answers to PLOF and home set up questions.. Pt will benefit from continued acute OT services and intensive inpatient follow up therapy, >3 hours/day after discharge.      If plan is discharge home, recommend the following:   A lot of help with walking and/or transfers;Two people to help with walking and/or transfers;A lot of help with bathing/dressing/bathroom;Two people to help with bathing/dressing/bathroom;Assistance with cooking/housework;Assistance with feeding;Direct supervision/assist for medications management;Direct supervision/assist for financial management;Assist for transportation;Help with stairs or ramp for entrance;Supervision due to cognitive status     Functional Status Assessment   Patient has had a recent decline in their functional status and demonstrates the ability to make significant improvements in function in a reasonable and predictable amount of time.     Equipment Recommendations   None recommended by OT      Recommendations for Other Services   Rehab consult     Precautions/Restrictions   Precautions Precautions: Fall;Other (comment) Recall of Precautions/Restrictions: Impaired Precaution/Restrictions Comments: SBP < 180 Restrictions Weight Bearing Restrictions Per Provider Order: No     Mobility Bed Mobility Overal bed mobility: Needs Assistance Bed Mobility: Rolling, Sidelying to Sit, Sit to Supine Rolling: Total assist, +2 for physical assistance, +2 for safety/equipment Sidelying to sit: Total assist, +2 for physical assistance, +2 for safety/equipment, HOB elevated   Sit to supine: Total assist, +2 for physical assistance, +2 for safety/equipment, HOB elevated   General bed mobility comments: Pt not following commands to assist with mobility, thereby requiring total assist x2 for all bed mobility aspects.    Transfers Overall transfer level: Needs assistance Equipment used: 2 person hand held assist Transfers: Sit to/from Stand Sit to Stand: Total assist, +2 physical assistance, +2 safety/equipment           General transfer comment: Bil knees blocked with pt holding onto therapist's arm with her R as cued, but poor initiation noted by pt. Noted mild activation on R and in trunk once dependently lifted to stand, but pt maintained a very flexed posture despite max cues to extend hips, trunk, and neck.      Balance Overall balance assessment: Needs assistance Sitting-balance support: Single extremity supported, Bilateral upper extremity supported, Feet supported Sitting balance-Leahy Scale: Poor Sitting balance - Comments: Pt with L lateral and posterior lean, needing min-modA for static sitting balance, intermittently progressed to CGA briefly (~5-10 sec) with max cuing. Tends to sit with neck flexed, but able to extend briefly when cued Postural control: Left lateral lean, Posterior lean Standing balance support: Bilateral upper extremity supported Standing  balance-Leahy Scale: Zero Standing balance comment: Total assist x2 to stand with bil HHA and bil knee block, trunk flexed throughout                           ADL either performed or assessed with clinical judgement   ADL Overall ADL's : Needs assistance/impaired                                       General ADL Comments: total A for all aspects of care at bed level     Vision Baseline Vision/History: 0 No visual deficits Vision Assessment?: Vision impaired- to be further tested in functional context Additional Comments: eyes closed 99% of the session. noted R gaze, tracked to midline in sitting. Pt visually attended to L 1x briefly     Perception Perception: Not tested       Praxis Praxis: Not tested       Pertinent Vitals/Pain Pain Assessment Pain Assessment: Faces Faces Pain Scale: No hurt Pain Intervention(s): Monitored during session     Extremity/Trunk Assessment Upper Extremity Assessment Upper Extremity Assessment: LUE deficits/detail LUE Deficits / Details: flaccid, no response to noxious stim at the nailbed or trap LUE Sensation: decreased light touch;decreased proprioception LUE Coordination: decreased fine motor;decreased gross motor   Lower Extremity Assessment Lower Extremity Assessment: Defer to PT evaluation LLE Deficits / Details: no active movement noted except did withdraw from noxious stimuli; WFL PROM   Cervical / Trunk Assessment Cervical / Trunk Assessment: Kyphotic   Communication Communication Communication: Impaired Factors Affecting Communication: Reduced clarity of speech;Difficulty expressing self (nonverbal until the end)   Cognition Arousal: Lethargic Behavior During Therapy: Flat affect Cognition: Difficult to assess Difficult to assess due to: Impaired communication, Level of arousal           OT - Cognition Comments: pt very sleepy throughout, at the end of the session she was more alert and  answered orientation, PLOF and home set up questions. She also followed some simple commands to move her R hemibody                 Following commands: Impaired Following commands impaired: Follows one step commands with increased time, Follows one step commands inconsistently     Cueing  General Comments   Cueing Techniques: Verbal cues;Tactile cues;Visual cues  SBP 170s throughout then SBP = 180 supine end of session, notified RN, otherwise VSS on supplemental O2 via [place]   Exercises     Shoulder Instructions      Home Living Family/patient expects to be discharged to:: Private residence Living Arrangements: Alone   Type of Home: House       Home Layout: One level               Home Equipment: Cane - single point          Prior Functioning/Environment Prior Level of Function : Independent/Modified Independent;Driving             Mobility Comments: Used SPC per pt ADLs Comments: pt states indep, drives    OT Problem List: Decreased strength;Decreased range of motion;Decreased activity tolerance;Impaired balance (sitting and/or standing);Impaired vision/perception;Decreased coordination;Decreased cognition;Decreased safety awareness;Decreased knowledge of use of DME or AE;Decreased knowledge of precautions;Impaired UE functional use;Impaired sensation   OT Treatment/Interventions: Self-care/ADL training;Therapeutic exercise;DME and/or AE instruction;Energy conservation;Therapeutic activities;Patient/family education;Balance training  OT Goals(Current goals can be found in the care plan section)   Acute Rehab OT Goals Patient Stated Goal: unable to state OT Goal Formulation: Patient unable to participate in goal setting Time For Goal Achievement: 12/19/23 Potential to Achieve Goals: Good ADL Goals Pt Will Perform Upper Body Dressing: with mod assist;sitting Pt Will Perform Lower Body Dressing: with max assist;sit to/from stand Pt Will Transfer  to Toilet: with max assist;stand pivot transfer;bedside commode Additional ADL Goal #1: Pt will complete bed mobility with mod A as a precursor to ADLs Additional ADL Goal #2: pt will follow simple 1 step commands with 50% accuracy during functional task   OT Frequency:  Min 2X/week    Co-evaluation   Reason for Co-Treatment: For patient/therapist safety;To address functional/ADL transfers;Necessary to address cognition/behavior during functional activity PT goals addressed during session: Mobility/safety with mobility;Balance OT goals addressed during session: ADL's and self-care      AM-PAC OT 6 Clicks Daily Activity     Outcome Measure Help from another person eating meals?: Total Help from another person taking care of personal grooming?: Total Help from another person toileting, which includes using toliet, bedpan, or urinal?: Total Help from another person bathing (including washing, rinsing, drying)?: Total Help from another person to put on and taking off regular upper body clothing?: Total Help from another person to put on and taking off regular lower body clothing?: Total 6 Click Score: 6   End of Session Nurse Communication: Mobility status  Activity Tolerance: Patient limited by lethargy Patient left: in bed;with bed alarm set;with call bell/phone within reach  OT Visit Diagnosis: Unsteadiness on feet (R26.81);Other abnormalities of gait and mobility (R26.89);Muscle weakness (generalized) (M62.81);Hemiplegia and hemiparesis Hemiplegia - Right/Left: Left Hemiplegia - caused by: Cerebral infarction                Time: 1420-1441 OT Time Calculation (min): 21 min Charges:  OT General Charges $OT Visit: 1 Visit OT Evaluation $OT Eval Moderate Complexity: 1 Mod  Lucie Kendall, OTR/L Acute Rehabilitation Services Office [phone] Secure Chat Communication Preferred   [name] 12/05/2023, 3:26 PM

## 2023-12-05 NOTE — Progress Notes (Signed)
 PCCM note  Patient extubated yesterday.  Respiratory status stable CODE STATUS DNR/DNI PCCM will sign off and will be available as needed.  Please call with questions  Lonna Coder MD [facility] 12/05/2023, 5:05 PM

## 2023-12-05 NOTE — Consult Note (Signed)
 Consultation Note Date: 12/05/2023   Patient Name: Heather Holt  DOB: April 28, 1930  MRN: [id number]  Age / Sex: 89 y.o., female  PCP: Godwin Shed, MD Referring Physician: Stroke, Md, MD  Reason for Consultation: Establishing goals of care  HPI/Patient Profile: 89 y.o. female  with past medical history of CKD, depression, A-fib, GERD, and hypertension admitted on 12/01/2023 with acute right MCA stroke.  Intubated for airway protection. Status post IR thrombectomy and stent placement. Extubated with plan to not reintubate, transition to comfort is not doing well s/p extubation. Patient has tolerated extubation so far. PMT consulted to discuss GOC. SLP eval pending.   Clinical Assessment and Goals of Care: I have reviewed medical records including EPIC notes, labs and imaging, received report from RN and neuro team, assessed the patient and then met with patient's son and DIL [name] and [name])  to discuss diagnosis prognosis, GOC, EOL wishes, disposition and options.  I introduced Palliative Medicine as specialized medical care for people living with serious illness. It focuses on providing relief from the symptoms and stress of a serious illness. The goal is to improve quality of life for both the patient and the family.  We discussed a brief life review of the patient. Patient has 2 sons (unfortunately her other son passed away in 02-Jan-2015 with cancer). She has a niece [name] who she refers to as a daughter. She is also very close to her grandson [name].   As far as functional and nutritional status, patient was totally independent prior to current admission. She cooked daily, ate well, cared for herself independently. She was active in the community, attended church every Sunday. She took friends to the airport, drove to [place]. Family shares how shocking current situation is given her independent baseline.    We discussed patient's current illness  and what it means in the larger context of patient's on-going co-morbidities.  Natural disease trajectory and expectations at EOL were discussed. We reviewed her large stroke and how it has impacted her. Thankfully, has been successful off of ventilator. We review potential impact on swallowing and plan for more evaluation with SLP. They are open to this eval and open to MBS.   I attempted to elicit values and goals of care important to the patient.    Family is clear they would not want her reintubated if her respiratory status declined - she would not want this and would certainly never want a trach. They are also clear she would never want a PEG. They are open to considering a cortrak temporarily however this also depends on how she does in evals. If there is some hope for improvement in swallow they would be open to cortrak but if she does very poorly in evals they will likely not pursue cortrak. Encouraged them to ask questions during SLP eval. Discussed situation with SLP as well.   Family also shares patient previously expressed she would never want to be in a SNF. They hope to avoid this but also recognize she may need this care depending on how things go. We discussed it is to early to know about this and we need more time for outcomes.   Family does confirm they have HCPOA documents listing her son Norleen as primary HCPOA with grandson [name] as secondary.   Confirmed DNR/DNI.  Discussed with family the importance of continued conversation with family and the medical providers regarding overall plan of care and treatment options, ensuring decisions are within the context  of the patient's values and GOCs.    Questions and concerns were addressed. The family was encouraged to call with questions or concerns.    Primary Decision Maker HCPOA - son Norleen    SUMMARY OF RECOMMENDATIONS   - await SLP eval/MBS - family may consider temporary cortrak depending on results - would not want peg or  trach - no reintubation - DNR/DNI - ongoing palliative discussions, will see again 10/17  Code Status/Advance Care Planning: DNR      Primary Diagnoses: Present on Admission:  Stroke [facility])  Acute right MCA stroke (HCC)   I have reviewed the medical record, interviewed the patient and family, and examined the patient. The following aspects are pertinent.  Past Medical History:  Diagnosis Date   CKD (chronic kidney disease)    Depression    GERD (gastroesophageal reflux disease)    History of kidney stones    Hypertension    Osteoarthritis of lumbar spine    Primary localized osteoarthrosis of the knee, left    Radicular syndrome of left leg    Vitamin D  deficiency    Social History   Socioeconomic History   Marital status: Widowed    Spouse name: Not on file   Number of children: Not on file   Years of education: Not on file   Highest education level: Not on file  Occupational History   Not on file  Tobacco Use   Smoking status: Former    Current packs/day: 0.00    Types: Cigarettes    Quit date: 04/21/1986    Years since quitting: 37.6   Smokeless tobacco: Never  Vaping Use   Vaping status: Never Used  Substance and Sexual Activity   Alcohol use: No   Drug use: No   Sexual activity: Not on file  Other Topics Concern   Not on file  Social History Narrative   Not on file   Social Drivers of Health   Financial Resource Strain: Low Risk  (08/29/2018)   Received from [facility] visits prior to 04/21/2022.   Overall Financial Resource Strain (CARDIA)    Difficulty of Paying Living Expenses: Not hard at all  Food Insecurity: Low Risk  (10/17/2023)   Received from [facility]   Hunger Vital Sign    Within the past 12 months, you worried that your food would run out before you got money to buy more: Never true    Within the past 12 months, the food you bought just didn't last and you didn't have money to get more. : Never true   Transportation Needs: No Transportation Needs (10/17/2023)   Received from [facility]    In the past 12 months, has lack of reliable transportation kept you from medical appointments, meetings, work or from getting things needed for daily living? : No  Physical Activity: Not on file  Stress: Not on file  Social Connections: Not on file   Family History  Problem Relation Age of Onset   Cancer Mother    Diabetes Sister    Scheduled Meds:  aspirin  300 mg Rectal Daily   atorvastatin  20 mg Per Tube Daily   carvedilol  25 mg Per Tube BID   Chlorhexidine  Gluconate Cloth  6 each Topical Q0600   heparin injection (subcutaneous)  5,000 Units Subcutaneous Q8H   hydrALAZINE   100 mg Per Tube TID   pantoprazole  (PROTONIX ) IV  40 mg Intravenous Q24H   polyethylene  glycol  17 g Per Tube BID   senna-docusate  1 tablet Per Tube BID   sertraline  25 mg Per Tube Daily   torsemide  20 mg Per Tube Daily   Continuous Infusions:  sodium chloride  40 mL/hr at 12/05/23 0900   clevidipine Stopped (12/04/23 0947)   potassium chloride 10 mEq (12/05/23 1023)   PRN Meds:.acetaminophen  **OR** acetaminophen  (TYLENOL ) oral liquid 160 mg/5 mL **OR** acetaminophen , hydrALAZINE , labetalol, ondansetron  (ZOFRAN ) IV, mouth rinse, senna-docusate Allergies  Allergen Reactions   Norvasc [Amlodipine] Swelling   Review of Systems  Unable to perform ROS: Mental status change    Physical Exam Constitutional:      Comments: Does not open eyes but does respond yes/no appropriately to questions, falls asleep during conversation  Cardiovascular:     Rate and Rhythm: Normal rate.  Pulmonary:     Effort: Pulmonary effort is normal.  Musculoskeletal:     Comments: Strong grip on R, does not grip on L to my command     Vital Signs: BP (!) 179/88   Pulse 84   Temp 98.7 F (37.1 C) (Axillary)   Resp 16   Ht 5' 9 (1.753 m)   Wt 80 kg   SpO2 100%   BMI 26.05 kg/m  Pain Scale: CPOT        SpO2: SpO2: 100 % O2 Device:SpO2: 100 % O2 Flow Rate: .O2 Flow Rate (L/min): 1 L/min  IO: Intake/output summary:  Intake/Output Summary (Last 24 hours) at 12/05/2023 1024 Last data filed at 12/05/2023 0900 Gross per 24 hour  Intake 1110.42 ml  Output 1845 ml  Net -734.58 ml    LBM: Last BM Date :  (PTA) Baseline Weight: Weight: 80 kg Most recent weight: Weight: 80 kg        *Please note that this is a verbal dictation therefore any spelling or grammatical errors are due to the [employer] One system interpretation.   Time Total: 75 minutes Time spent includes: Detailed review of medical records (labs, imaging, vital signs), medically appropriate exam, discussion with treatment team, counseling and educating patient, family and/or staff, documenting clinical information, medication management and coordination of care.    Tobey Jama Barnacle, DNP, AGNP-C Palliative Medicine Team [phone] Pager: [phone]

## 2023-12-05 NOTE — Progress Notes (Signed)
 Inpatient Rehab Admissions Coordinator Note:  Per therapy recommendations, pt was screened for candidacy for CIR by Reche FORBES Lowers, PT.  At time of eval, pt total +2, lethargic, but able to sit statically well.  No consult recommended, however I will rescreen tomorrow for progress and see if she could potentially be a candidate for CIR.    Reche Lowers, PT, DPT [phone] 12/05/23 3:09 PM

## 2023-12-05 NOTE — Progress Notes (Signed)
 Pharmacy Electrolyte Replacement  Recent Labs:  Recent Labs    12/05/23 0547  K 3.6  MG 2.3  PHOS 4.0  CREATININE 1.10*    Low Critical Values (K </= 2.5, Phos </= 1, Mg </= 1) Present: None  Plan: KCL 40 mEq IV.   Amandamarie Feggins, PharmD

## 2023-12-05 NOTE — Evaluation (Addendum)
 Physical Therapy Evaluation Patient Details Name: Heather Holt MRN: [id number] DOB: 1930/07/17 Today's Date: 12/05/2023  History of Present Illness  Heather Holt is a 89 yo female who presented 10/12 after friends called for a well check. Pt was found on her bed with R gaze and L weakness. S/p R MCA thrombectomy 10/12. MRI showed R MCA infarct. ETT 10/12 - 10/15. PMHx: CKD, depression, persistent afib, GERD, HTN, osteoarthritis, vitamin D  deficiency   Clinical Impression  Pt presents with condition above and deficits mentioned below, see PT Problem List. PTA, she reports being mod I using a cane, living alone. Currently, the pt is very lethargic, initially not verbally responding or keeping eyes open except briefly until returned to supine at end of session. Then pt began to verbally answer questions and keep her eyes open. Pt can visually track to midline but not past it to L. Pt did not show any active movement in her L extremities, but did withdraw to noxious stimuli at her L leg. The pt displays L inattention along with deficits in cognition, balance, and activity tolerance. She is currently requiring total assist x2 for bed mobility and transfers due to poor level of arousal initially. She was able to sit statically with min-modA and intermittent brief episodes of CGA. Currently, due to her drastic functional decline, recommending intensive inpatient rehab, > 3 hours/day, provided her activity tolerance progresses to tolerate the intensive rehab program. Will continue to follow acutely.        If plan is discharge home, recommend the following: Two people to help with walking and/or transfers;Two people to help with bathing/dressing/bathroom;Assistance with cooking/housework;Assistance with feeding;Direct supervision/assist for medications management;Direct supervision/assist for financial management;Assist for transportation;Help with stairs or ramp for entrance;Supervision due to cognitive  status   Can travel by private vehicle        Equipment Recommendations Other (comment) (TBD)  Recommendations for Other Services  Rehab consult    Functional Status Assessment Patient has had a recent decline in their functional status and demonstrates the ability to make significant improvements in function in a reasonable and predictable amount of time.     Precautions / Restrictions Precautions Precautions: Fall;Other (comment) Recall of Precautions/Restrictions: Impaired Precaution/Restrictions Comments: SBP < 180 Restrictions Weight Bearing Restrictions Per Provider Order: No      Mobility  Bed Mobility Overal bed mobility: Needs Assistance Bed Mobility: Rolling, Sidelying to Sit, Sit to Supine Rolling: Total assist, +2 for physical assistance, +2 for safety/equipment Sidelying to sit: Total assist, +2 for physical assistance, +2 for safety/equipment, HOB elevated   Sit to supine: Total assist, +2 for physical assistance, +2 for safety/equipment, HOB elevated   General bed mobility comments: Pt not following commands to assist with mobility, thereby requiring total assist x2 for all bed mobility aspects.    Transfers Overall transfer level: Needs assistance Equipment used: 2 person hand held assist Transfers: Sit to/from Stand Sit to Stand: Total assist, +2 physical assistance, +2 safety/equipment           General transfer comment: Bil knees blocked with pt holding onto therapist's arm with her R as cued, but poor initiation noted by pt. Noted mild activation on R and in trunk once dependently lifted to stand, but pt maintained a very flexed posture despite max cues to extend hips, trunk, and neck.    Ambulation/Gait               General Gait Details: unable at this time  Stairs  Wheelchair Mobility     Tilt Bed    Modified Rankin (Stroke Patients Only) Modified Rankin (Stroke Patients Only) Pre-Morbid Rankin Score: Slight  disability Modified Rankin: Severe disability     Balance Overall balance assessment: Needs assistance Sitting-balance support: Single extremity supported, Bilateral upper extremity supported, Feet supported Sitting balance-Leahy Scale: Poor Sitting balance - Comments: Pt with L lateral and posterior lean, needing min-modA for static sitting balance, intermittently progressed to CGA briefly (~5-10 sec) with max cuing. Tends to sit with neck flexed, but able to extend briefly when cued Postural control: Left lateral lean, Posterior lean Standing balance support: Bilateral upper extremity supported Standing balance-Leahy Scale: Zero Standing balance comment: Total assist x2 to stand with bil HHA and bil knee block, trunk flexed throughout                             Pertinent Vitals/Pain Pain Assessment Pain Assessment: Faces Faces Pain Scale: No hurt Pain Intervention(s): Monitored during session    Home Living Family/patient expects to be discharged to:: Private residence Living Arrangements: Alone   Type of Home: House         Home Layout: One level Home Equipment: Cane - single point      Prior Function Prior Level of Function : Independent/Modified Independent;Driving             Mobility Comments: Used SPC per pt       Extremity/Trunk Assessment   Upper Extremity Assessment Upper Extremity Assessment: Defer to OT evaluation    Lower Extremity Assessment Lower Extremity Assessment: LLE deficits/detail LLE Deficits / Details: no active movement noted except did withdraw from noxious stimuli; WFL PROM    Cervical / Trunk Assessment Cervical / Trunk Assessment: Kyphotic  Communication   Communication Communication: Impaired Factors Affecting Communication: Reduced clarity of speech;Difficulty expressing self (nonverbal until the end)    Cognition Arousal: Lethargic Behavior During Therapy: Flat affect   PT - Cognitive impairments:  Difficult to assess, Attention, Initiation, Sequencing, Orientation Difficult to assess due to: Level of arousal Orientation impairments: Situation                   PT - Cognition Comments: Pt able to state her name and the correct year and location, but did not go into depth further on date. Pt unaware of situation. Initially, pt was very lethargic, not opening eyes except briefly to command and to noxious stimuli. Once returned to supine at end of session, pt became more alert, opening eyes and speaking (was nonverbal prior to this). Pt followed cues with her R side inconsistently (~70% of the time), but did not attend to her L. Could visually track to midline but not past midline to L. Following commands: Impaired Following commands impaired: Follows one step commands with increased time, Follows one step commands inconsistently     Cueing Cueing Techniques: Verbal cues, Tactile cues, Visual cues     General Comments General comments (skin integrity, edema, etc.): SBP 170s throughout then SBP = 180 supine end of session, notified RN, otherwise VSS on supplemental O2 via [place]    Exercises     Assessment/Plan    PT Assessment Patient needs continued PT services  PT Problem List Decreased strength;Decreased activity tolerance;Decreased balance;Decreased mobility;Decreased cognition;Decreased knowledge of use of DME;Cardiopulmonary status limiting activity       PT Treatment Interventions DME instruction;Gait training;Functional mobility training;Therapeutic activities;Therapeutic exercise;Neuromuscular re-education;Balance training;Cognitive remediation;Patient/family education;Wheelchair mobility  training    PT Goals (Current goals can be found in the Care Plan section)  Acute Rehab PT Goals Patient Stated Goal: agreeable to session, did not state goal PT Goal Formulation: With patient Time For Goal Achievement: 12/19/23 Potential to Achieve Goals: Fair    Frequency Min  3X/week     Co-evaluation   Reason for Co-Treatment: For patient/therapist safety;To address functional/ADL transfers;Necessary to address cognition/behavior during functional activity PT goals addressed during session: Mobility/safety with mobility;Balance OT goals addressed during session: ADL's and self-care       AM-PAC PT 6 Clicks Mobility  Outcome Measure Help needed turning from your back to your side while in a flat bed without using bedrails?: Total Help needed moving from lying on your back to sitting on the side of a flat bed without using bedrails?: Total Help needed moving to and from a bed to a chair (including a wheelchair)?: Total Help needed standing up from a chair using your arms (e.g., wheelchair or bedside chair)?: Total Help needed to walk in hospital room?: Total Help needed climbing 3-5 steps with a railing? : Total 6 Click Score: 6    End of Session Equipment Utilized During Treatment: Oxygen Activity Tolerance: Patient limited by lethargy Patient left: in bed;with call bell/phone within reach;with bed alarm set Nurse Communication: Mobility status;Other (comment) (BP) PT Visit Diagnosis: Unsteadiness on feet (R26.81);Muscle weakness (generalized) (M62.81);Difficulty in walking, not elsewhere classified (R26.2);Other symptoms and signs involving the nervous system (R29.898);Hemiplegia and hemiparesis Hemiplegia - Right/Left: Left Hemiplegia - caused by: Cerebral infarction    Time: 1420-1443 PT Time Calculation (min) (ACUTE ONLY): 23 min   Charges:   PT Evaluation $PT Eval Moderate Complexity: 1 Mod   PT General Charges $$ ACUTE PT VISIT: 1 Visit         Theo Ferretti, PT, DPT Acute Rehabilitation Services  Office: [phone]   Theo CHRISTELLA Ferretti 12/05/2023, 3:10 PM

## 2023-12-05 NOTE — Progress Notes (Addendum)
 STROKE TEAM PROGRESS NOTE    SIGNIFICANT HOSPITAL EVENTS 10/12- Code stroke, IR 10/15-patient extubated  INTERIM HISTORY/SUBJECTIVE Patient has been hypertensive and continues to require as needed medications to keep blood pressure under control.  Family meeting was had with palliative care, and family has made decision to make patient DNR/DNI.  She will require modified barium swallow study tomorrow and is currently NPO.  OBJECTIVE  CBC    Component Value Date/Time   WBC 8.1 12/05/2023 0547   RBC 4.49 12/05/2023 0547   HGB 12.4 12/05/2023 0547   HCT 39.9 12/05/2023 0547   PLT 153 12/05/2023 0547   MCV 88.9 12/05/2023 0547   MCH 27.6 12/05/2023 0547   MCHC 31.1 12/05/2023 0547   RDW 14.6 12/05/2023 0547   LYMPHSABS 0.6 (L) 12/01/2023 1128   MONOABS 0.2 12/01/2023 1128   EOSABS 0.0 12/01/2023 1128   BASOSABS 0.0 12/01/2023 1128    BMET    Component Value Date/Time   NA 143 12/05/2023 0547   K 3.6 12/05/2023 0547   CL 107 12/05/2023 0547   CO2 23 12/05/2023 0547   GLUCOSE 105 (H) 12/05/2023 0547   BUN 23 12/05/2023 0547   CREATININE 1.10 (H) 12/05/2023 0547   CALCIUM 9.2 12/05/2023 0547   GFRNONAA 47 (L) 12/05/2023 0547    IMAGING past 24 hours No results found.   Vitals:   12/05/23 1200 12/05/23 1201 12/05/23 1300 12/05/23 1400  BP: (!) 168/88 (!) 168/88 (!) 168/94 (!) 177/92  Pulse: 91 88 86 87  Resp: 20 20 19 18   Temp:  98.6 F (37 C)    TempSrc:  Axillary    SpO2: 99% 99% 99% 99%  Weight:      Height:          Physical Exam  Constitutional: Ill-appearing elderly patient in no acute distress Psych: Affect blunted Eyes: No scleral injection.  HENT: ETT in place Head: Normocephalic.  Cardiovascular: A-fib on the monitor Respiratory: Regular, unlabored respirations on supplemental O2 Skin: WDI.   Neuro: Mental Status: Patient is drowsy but opens eyes to voice and is able to state name, location and correct month and year.  Left facial droop  noted, able to track examiner around the room she moves her right upper extremity to command with antigravity strength and follows commands with right lower extremity.  No movement of left upper extremity noted, and some withdrawal of left lower extremity to noxious.    ASSESSMENT/PLAN  [name] is a 89 y.o. female with history of CKD, depression, persistent afib, GERD, HTN, osteoarthritis, vitamin D  deficiency presenting as a code stroke via EMS.  NIH on Admission 25  Stroke:  right large MCA territory infarct with confluent petechial hemorrhage due to right M1 occlusion s/p IR with TICI2b, etiology: Likely Afib not on AC   CT Head Acute right MCA territory infarct.  ASPECTS 6/10 No hyperdense vessel. Atherosclerotic calcifications in the cavernous carotid arteries bilaterally.  CT angio Head and Neck Distal right M1 occlusion with poor collateralization of the anterior and superior divisions. CTP core infarct of 13 ml and larger area of ischemia in the right MCA territory measuring 94 ml.  However core infarct was underestimated due to pseudonormalization MRI  Acute large infarct in the right MCA territory with associated edema and slightly increased mass effect on the right lateral ventricle without significant midline shift. Heterogeneous areas of susceptibility in the right MCA territory concerning for petechial hemorrhage.  MRA  Interval recanalization of the  M1 segment of the right MCA compared to the recent prior CTA. The MCA and distal branches appear patent without evidence of high-grade stenosis. CT repeat 10/14 continued evolution of right MCA territory infarct with stable superimposed IPH, mass effect but no significant MLS 2D Echo 60 to 65%, LA severely dilated  LDL 61 HgbA1c 5.5 VTE prophylaxis - SCDs No antithrombotic prior to admission, now on ASA 300 PR given the confluent petechial hemorrhage. Will consider DOAC around day 7  Therapy recommendations:   CIR Disposition: Pending, palliative care on board  Atrial fibrillation Home Meds: prior auth for pradaxa was denied, does not appear to have been taking any other anticoagulation.  Continue telemetry monitoring Rate controlled so far On ASA 300 PR. Will consider DOAC around day 7 given the confluent petechial hemorrhage  Respiratory insuffiency post procedure  CCM following Remained Intubated post procedure Patient extubated 10/15 Tolerating well so far  Hypertension Home meds:  Coreg 25mg , hydralazine  100 mg 3 times daily, furosemide 20 mg daily Stable now off cleviprex Blood Pressure Goal: SBP less than 160  Long-term BP goal normotensive  Hyperlipidemia Home meds: Atorvastatin 20mg  LDL 61, goal < 70 Resume lipitor once po access Continue statin at discharge  Dysphagia  Post stroke dysphagia Now NPO Speech on board On gentle IVF Modified barium swallow study tomorrow Will consider TF if needed tomorrow, pending GOC discussion  Other stroke risk factors Advanced age  Other active problems CKD Stage 3a, Cr 1.4-1.15-1.44-1.27-1.30- 1.10, GFR 47 Hypokalemia, Replaced GERD, on PPI   Hospital day # 4  Patient seen and examined by NP/APP with MD. MD to update note as needed.   Cortney E Everitt Clint Kill , MSN, AGACNP-BC [facility] See Amion for schedule and pager information 12/05/2023 2:51 PM  ATTENDING NOTE: I reviewed above note and agree with the assessment and plan. Pt was seen and examined.   Son and daughter in law are at the bedside. Pt lethargic but open eyes on voice, still has right gaze prefernce and able to cross midline. Still has left hemiplegia, but R UE and LE following commands with spontaneous movement. Did not pass swallow, on IVF. On ASA now, will consider DOAC around day 7. PT OT recommend CIR. Palliative care on board for GOC discussion, may need TF if not passing swallow based on GOC discussion.  For detailed assessment and plan,  please refer to above as I have made changes wherever appropriate.   Heather Cummins, MD PhD Stroke Neurology 12/05/2023 5:37 PM  This patient is critically ill due to large right MCA stroke, acute petechial hemorrhage, status post thrombectomy, cerebral edema, respiratory failure, A-fib not on AC and at significant risk of neurological worsening, death form recurrent stroke, hemorrhagic termination, brain herniation, heart failure, seizure. This patient's care requires constant monitoring of vital signs, hemodynamics, respiratory and cardiac monitoring, review of multiple databases, neurological assessment, discussion with family, other specialists and medical decision making of high complexity. I spent 35 minutes of neurocritical care time in the care of this patient. I had long discussion with son and daughter-in-law at bedside, updated pt current condition, treatment plan and potential prognosis, and answered all the questions. They expressed understanding and appreciation. I also discussed with CCM and palliative care service.    To contact Stroke Continuity provider, please refer to [email or web address]. After hours, contact General Neurology

## 2023-12-05 NOTE — Progress Notes (Signed)
 Bedside Swallow Assessment     12/05/23 0954  SLP Visit Information  SLP Received On 12/05/23  General Information  Date of Onset 12/05/23  HPI [name] is a 89 y.o. female with history of CKD, depression, persistent afib, GERD, HTN, osteoarthritis, vitamin D  deficiency presenting as a code stroke via EMS.  NIH on Admission 25. Found to have right large MCA territory infarct with confluent petechial hemorrhage due to right M1 occlusion s/p IR with TICI2b and underwent RMCA thrombextomy 10/12. Intubated 10/12-10/15.  Type of Study Bedside Swallow Evaluation  Previous Swallow Assessment  (none)  Diet Prior to this Study NPO  Temperature Spikes Noted No  Respiratory Status Room air  History of Recent Intubation Yes  Total duration of intubation (days) 4 days  Date extubated 12/04/23  Behavior/Cognition Lethargic/Drowsy;Other (Comment);Cooperative;Pleasant mood;Requires cueing;Distractible (able to participate)  Oral Cavity Assessment Dry  Oral Care Completed by SLP Yes  Oral Cavity - Dentition Dentures, top;Dentures, bottom  Vision  (?)  Self-Feeding Abilities Needs assist  Patient Positioning Upright in bed  Baseline Vocal Quality Low vocal intensity  Volitional Cough Weak  Volitional Swallow Unable to elicit (attempted but unable)  Pain Assessment  Pain Assessment Faces  Faces Pain Scale 0  Facial Expression 0  Body Movements 0  Muscle Tension 0  Compliance with ventilator (intubated pts.) N/A  Vocalization (extubated pts.) 0  CPOT Total 0  Oral Assessment (Complete on admission/transfer/every shift)  Oral Assessment  (WDL) X  Lips Asymmetrical  Teeth Dentures upper;Dentures lower;Missing (Comment)  Tongue Dry;Swollen  Mucous Membrane(s) Pink  Saliva Insufficient  Level of Consciousness  (able to arouse)  Is patient on any of following O2 devices? None of the above  Nutritional status Fluids only or NPO for >24 hours  Oral Assessment Risk  Low Risk  Oral  Motor/Sensory Function  Overall Oral Motor/Sensory Function Severe impairment  Facial ROM Reduced left;Suspected CN VII (facial) dysfunction  Facial Symmetry Abnormal symmetry left;Suspected CN VII (facial) dysfunction  Lingual ROM Reduced left;Suspected CN XII (hypoglossal) dysfunction  Lingual Symmetry Abnormal symmetry right  Ice Chips  Ice chips Impaired  Presentation Spoon  Oral Phase Functional Implications Prolonged oral transit  Pharyngeal Phase Impairments Suspected delayed Swallow  Thin Liquid  Thin Liquid Impaired  Presentation Spoon;Cup  Oral Phase Impairments Reduced labial seal  Oral Phase Functional Implications Left anterior spillage;Prolonged oral transit  Pharyngeal  Phase Impairments Suspected delayed Swallow;Cough - Immediate  Nectar Thick Liquid  Nectar Thick Liquid NT  Honey Thick Liquid  Honey Thick Liquid NT  Puree  Puree Impaired  Presentation Spoon  Oral Phase Functional Implications Prolonged oral transit;Oral residue  Pharyngeal Phase Impairments Suspected delayed Swallow  Solid  Solid NT  SLP - End of Session  Patient left in bed;with family/visitor present  Nurse Communication Diet recommendation  SLP Assessment  Clinical Impression Statement (ACUTE ONLY) Pt encountered in bed with son and dtr-in-law present awake but keeps eye closed. Her tongue appears edematous and protruded to right side. Significant CN VII and CN XII involvement with marked reduced left facial movement, asymmetrical tongue and inability to lateralize tongue to left. Vocal intensity is hypophonic, mostly clear quality and weak volitional cough. Oral manipulation with ice chip and puree appeared slow with anterior and mild left residue which she eventually cleared. Swallow appreciated however appeared delayed with suspected decreased airway protection after immediate cough with cup sips thin. Discussed findings with pt/family/RN to continue NPO allowing ice chips after oral care and  proceed with MBS tomorrow.  SLP Visit Diagnosis Dysphagia, unspecified (R13.10)  Impact on safety and function Moderate aspiration risk;Severe aspiration risk  Other Related Risk Factors Lethargy;Cognitive impairment;Prolonged intubation;Deconditioning  Swallow Evaluation Recommendations  SLP Diet Recommendations Ice chips PRN after oral care;NPO  Postural Changes Seated upright at 90 degrees  Treatment Plan  Oral Care Recommendations Oral care QID;Oral care prior to ice chip/H20  Treatment Recommendations Defer until completion of intrumental exam  Follow Up Recommendations  (TBD)  Prognosis  Prognosis for improved oropharyngeal function  (fair-guarded)  Barriers to Reach Goals Severity of deficits  Individuals Consulted  Consulted and Agree with Results and Recommendations Patient;Family member/caregiver;RN  Family Member Consulted son, dtr-in-law  SLP Time Calculation  SLP Start Time (ACUTE ONLY) 1124  SLP Stop Time (ACUTE ONLY) 1140  SLP Time Calculation (min) (ACUTE ONLY) 16 min  SLP Evaluations  $ SLP Speech Visit 1 Visit  SLP Evaluations  $BSS Swallow 1 Procedure    Olam Bull Mill[place] M.Ed Architect [phone]

## 2023-12-05 NOTE — TOC Initial Note (Signed)
 Transition of Care [facility]) - Initial/Assessment Note    Patient Details  Name: Nakenya Theall MRN: [id number] Date of Birth: Aug 07, 1930  Transition of Care [facility]) CM/SW Contact:    Inocente GORMAN Kindle, LCSW Phone Number: 12/05/2023, 4:40 PM  Clinical Narrative:                 Patient admitted from home with stroke and was extubated yesterday. CSW will continue to follow for patient's candidacy to tolerate CIR versus SNF placement.     Barriers to Discharge: Continued Medical Work up, English as a second language teacher   Patient Goals and CMS Choice            Expected Discharge Plan and Services In-house Referral: Clinical Social Work     Living arrangements for the past 2 months: Single Family Home                                      Prior Living Arrangements/Services Living arrangements for the past 2 months: Single Family Home Lives with:: Self Patient language and need for interpreter reviewed:: Yes Do you feel safe going back to the place where you live?: Yes      Need for Family Participation in Patient Care: Yes (Comment) Care giver support system in place?: Yes (comment)   Criminal Activity/Legal Involvement Pertinent to Current Situation/Hospitalization: No - Comment as needed  Activities of Daily Living      Permission Sought/Granted                  Emotional Assessment Appearance:: Appears stated age Attitude/Demeanor/Rapport: Unable to Assess Affect (typically observed): Unable to Assess   Alcohol / Substance Use: Not Applicable Psych Involvement: No (comment)  Admission diagnosis:  Stroke [facility]) [I63.9] Acute left-sided muscle weakness [M62.81] Cerebrovascular accident (CVA), unspecified mechanism (HCC) [I63.9] Stroke (cerebrum) [facility]) [I63.9] Patient Active Problem List   Diagnosis Date Noted   On mechanically assisted ventilation (HCC) 12/04/2023   Paroxysmal atrial fibrillation (HCC) 12/04/2023   Stroke (HCC) 12/01/2023   Acute right MCA  stroke (HCC) 12/01/2023   Hypertensive emergency 04/30/2016   Headache 04/30/2016   CKD (chronic kidney disease)    Acute nonintractable headache    Primary localized osteoarthrosis of the knee, left    Radicular syndrome of left leg    Osteoarthritis of lumbar spine    Vitamin D  deficiency    GERD (gastroesophageal reflux disease)    Depression    Pre-operative cardiovascular examination 03/16/2016   Essential hypertension, benign 03/16/2016   Abnormal EKG 03/16/2016   PCP:  Godwin Shed, MD Pharmacy:   [facility] [id number] - [place], [place] - [address] [place] [address] Phone: [phone] Fax: [phone]     Social Drivers of Health (SDOH) Social History: SDOH Screenings   Food Insecurity: Low Risk  (10/17/2023)   Received from [facility]  Housing: Low Risk  (10/17/2023)   Received from [facility]  Transportation Needs: No Transportation Needs (10/17/2023)   Received from [facility]  Utilities: Low Risk  (10/17/2023)   Received from [facility]  Financial Resource Strain: Low Risk  (08/29/2018)   Received from [facility] visits prior to 04/21/2022.  Tobacco Use: Medium Risk (12/01/2023)   SDOH Interventions:     Readmission Risk Interventions     No data to display

## 2023-12-06 ENCOUNTER — Inpatient Hospital Stay (HOSPITAL_COMMUNITY)

## 2023-12-06 DIAGNOSIS — G936 Cerebral edema: Secondary | ICD-10-CM | POA: Diagnosis not present

## 2023-12-06 DIAGNOSIS — I63511 Cerebral infarction due to unspecified occlusion or stenosis of right middle cerebral artery: Secondary | ICD-10-CM | POA: Diagnosis not present

## 2023-12-06 DIAGNOSIS — I639 Cerebral infarction, unspecified: Secondary | ICD-10-CM | POA: Diagnosis not present

## 2023-12-06 DIAGNOSIS — R400 Somnolence: Secondary | ICD-10-CM

## 2023-12-06 DIAGNOSIS — Z515 Encounter for palliative care: Secondary | ICD-10-CM | POA: Diagnosis not present

## 2023-12-06 DIAGNOSIS — R233 Spontaneous ecchymoses: Secondary | ICD-10-CM | POA: Diagnosis not present

## 2023-12-06 DIAGNOSIS — Z7189 Other specified counseling: Secondary | ICD-10-CM | POA: Diagnosis not present

## 2023-12-06 DIAGNOSIS — I4819 Other persistent atrial fibrillation: Secondary | ICD-10-CM | POA: Diagnosis not present

## 2023-12-06 LAB — CBC
HCT: 38.5 % (ref 36.0–46.0)
Hemoglobin: 12.1 g/dL (ref 12.0–15.0)
MCH: 27.8 pg (ref 26.0–34.0)
MCHC: 31.4 g/dL (ref 30.0–36.0)
MCV: 88.5 fL (ref 80.0–100.0)
Platelets: 162 K/uL (ref 150–400)
RBC: 4.35 MIL/uL (ref 3.87–5.11)
RDW: 14.4 % (ref 11.5–15.5)
WBC: 6.9 K/uL (ref 4.0–10.5)
nRBC: 0 % (ref 0.0–0.2)

## 2023-12-06 LAB — BASIC METABOLIC PANEL WITH GFR
Anion gap: 12 (ref 5–15)
BUN: 23 mg/dL (ref 8–23)
CO2: 22 mmol/L (ref 22–32)
Calcium: 9.1 mg/dL (ref 8.9–10.3)
Chloride: 110 mmol/L (ref 98–111)
Creatinine, Ser: 1.01 mg/dL — ABNORMAL HIGH (ref 0.44–1.00)
GFR, Estimated: 53 mL/min — ABNORMAL LOW (ref >60)
Glucose, Bld: 104 mg/dL — ABNORMAL HIGH (ref 70–99)
Potassium: 4 mmol/L (ref 3.5–5.1)
Sodium: 144 mmol/L (ref 135–145)

## 2023-12-06 LAB — MAGNESIUM: Magnesium: 2.3 mg/dL (ref 1.7–2.4)

## 2023-12-06 MED ORDER — CARVEDILOL 12.5 MG PO TABS
25.0000 mg | ORAL_TABLET | Freq: Two times a day (BID) | ORAL | Status: DC
  Administered 2023-12-06 – 2023-12-07 (×2): 25 mg via ORAL
  Filled 2023-12-06: qty 2

## 2023-12-06 MED ORDER — SERTRALINE HCL 50 MG PO TABS
25.0000 mg | ORAL_TABLET | Freq: Every day | ORAL | Status: DC
  Administered 2023-12-06 – 2023-12-07 (×2): 25 mg via ORAL
  Filled 2023-12-06: qty 1

## 2023-12-06 MED ORDER — SENNOSIDES-DOCUSATE SODIUM 8.6-50 MG PO TABS
1.0000 | ORAL_TABLET | Freq: Two times a day (BID) | ORAL | Status: DC
  Administered 2023-12-06 – 2023-12-07 (×3): 1 via ORAL
  Filled 2023-12-06 (×2): qty 1

## 2023-12-06 MED ORDER — CLEVIDIPINE BUTYRATE 0.5 MG/ML IV EMUL
INTRAVENOUS | Status: AC
  Administered 2023-12-06: 50 mg via INTRAVENOUS
  Filled 2023-12-06: qty 100

## 2023-12-06 MED ORDER — METOPROLOL TARTRATE 5 MG/5ML IV SOLN
5.0000 mg | Freq: Once | INTRAVENOUS | Status: AC
  Administered 2023-12-06: 5 mg via INTRAVENOUS
  Filled 2023-12-06: qty 5

## 2023-12-06 MED ORDER — HYDRALAZINE HCL 50 MG PO TABS
100.0000 mg | ORAL_TABLET | Freq: Three times a day (TID) | ORAL | Status: DC
  Administered 2023-12-06 (×2): 100 mg via ORAL
  Filled 2023-12-06: qty 2

## 2023-12-06 MED ORDER — CLEVIDIPINE BUTYRATE 0.5 MG/ML IV EMUL
0.0000 mg/h | INTRAVENOUS | Status: DC
  Administered 2023-12-06: 2 mg/h via INTRAVENOUS

## 2023-12-06 MED ORDER — LABETALOL HCL 5 MG/ML IV SOLN
20.0000 mg | INTRAVENOUS | Status: AC | PRN
  Administered 2023-12-06: 20 mg via INTRAVENOUS
  Filled 2023-12-06: qty 4

## 2023-12-06 MED ORDER — ATORVASTATIN CALCIUM 10 MG PO TABS
20.0000 mg | ORAL_TABLET | Freq: Every day | ORAL | Status: DC
  Administered 2023-12-06 – 2023-12-07 (×2): 20 mg via ORAL
  Filled 2023-12-06: qty 2

## 2023-12-06 MED ORDER — POLYETHYLENE GLYCOL 3350 17 G PO PACK
17.0000 g | PACK | Freq: Two times a day (BID) | ORAL | Status: DC
  Administered 2023-12-06 – 2023-12-07 (×2): 17 g via ORAL
  Filled 2023-12-06 (×2): qty 1

## 2023-12-06 MED ORDER — PANTOPRAZOLE SODIUM 40 MG PO TBEC
40.0000 mg | DELAYED_RELEASE_TABLET | Freq: Every day | ORAL | Status: DC
  Administered 2023-12-07: 40 mg via ORAL
  Filled 2023-12-06: qty 1

## 2023-12-06 MED ORDER — TORSEMIDE 20 MG PO TABS
20.0000 mg | ORAL_TABLET | Freq: Every day | ORAL | Status: DC
  Administered 2023-12-06: 20 mg via ORAL
  Filled 2023-12-06 (×2): qty 1

## 2023-12-06 MED ORDER — LABETALOL HCL 5 MG/ML IV SOLN
20.0000 mg | INTRAVENOUS | Status: DC | PRN
  Filled 2023-12-06: qty 4

## 2023-12-06 MED FILL — Aspirin Chew Tab 81 MG: 81.0000 mg | ORAL | Qty: 1 | Status: AC

## 2023-12-06 NOTE — Progress Notes (Signed)
   Inpatient Rehabilitation Admissions Coordinator   Patient continues to not be at a level to tolerate the intensity required of a CIR admit. Other venues should be pursued.  Heron Leavell, RN, MSN Rehab Admissions Coordinator [phone] 12/06/2023 5:57 PM

## 2023-12-06 NOTE — Progress Notes (Signed)
 Notified CCM Devon that Pt BP is staying in the 180s systolic after several PRNs and scheduled IV meds. Pt currently doesn't have feeding tube, barrium swallow study pending. See new orders placed by CCM. This RN is currently monitoring.

## 2023-12-06 NOTE — Progress Notes (Signed)
 Modified Barium Swallow Study  Patient Details  Name: Heather Holt MRN: [id number] Date of Birth: October 14, 1930  Today's Date: 12/06/2023  Modified Barium Swallow completed.  Full report located under Chart Review in the Imaging Section.  History of Present Illness [name] is a 89 y.o. female with history of CKD, depression, persistent afib, GERD, HTN, osteoarthritis, vitamin D  deficiency presenting as a code stroke via EMS.  NIH on Admission 25. Found to have right large MCA territory infarct with confluent petechial hemorrhage due to right M1 occlusion s/p IR with TICI2b and underwent RMCA thrombextomy 10/12. Intubated 10/12-10/15.   Clinical Impression Pt presents with moderate oropharyngeal dysphagia and suspected esophageal involvement. Pt unable to achieve labial closure with majority of bolus falling anteriorally from oral cavity with cup facilitated by slightly reclined posture and teaspoon presentations. Oral transport was slow and disorganized with lingual residue spilling into pharynx. Solid not administered. Laryngeal penetration and aspiration due to multifactorial components of decreased oral control/clearance, partial laryngeal elevation, incomplete glottal closure and pharyngeal residue.   Thin spilled from oral cavity onto vocal cords with trace silent aspiration during the swallow (PAS 8). Pyriform sinus residue from nectar penerated cords during the swallow (PAS 5), then was silently aspirated during subsequent swallows (PAS 8). Further instances of penetration with nectar (PAS 3) observed with return of fluoro presumed from lingual residuals. Cues for hard coughs were effective clearing penetrates and aspirated material was not detected and assumed to be ejected however unable to confirm. There was intermitent ascension of barium from PES due to suspected premature closure of PES. In addition esophageal scan revealed significant residue mid esophagus. Recommend Dys 1,  nectar thick via teaspoon, hard coughs during swallow, monitor for oral residue, crush pills, full assist and supervision and remain upright after meals. There is continued mild aspiration risk which was discussed with neurologist and Palliative care NP and pt's son and daughter-in-law who were in agreement with recommendations. Provided further education re: recommendations with family. ST will continue to follow. Factors that may increase risk of adverse event in presence of aspiration [name] [employer] [name] 2021): Reduced cognitive function;Frail or deconditioned;Inadequate oral hygiene;Dependence for feeding and/or oral hygiene;Limited mobility  Swallow Evaluation Recommendations Recommendations: PO diet PO Diet Recommendation: Dysphagia 1 (Pureed);Mildly thick liquids (Level 2, nectar thick) Liquid Administration via: Spoon Medication Administration: Crushed with puree Supervision: Full assist for feeding;Full supervision/cueing for swallowing strategies Swallowing strategies  : Slow rate;Small bites/sips;Check for pocketing or oral holding;Check for anterior loss;Hard cough after swallowing;Minimize environmental distractions Postural changes: Position pt fully upright for meals;Stay upright 30-60 min after meals Oral care recommendations: Oral care BID (2x/day) Caregiver Recommendations: Avoid jello, ice cream, thin soups, popsicles;Have oral suction available      Dustin Olam Bull 12/06/2023,5:16 PM

## 2023-12-06 NOTE — Progress Notes (Signed)
 Physical Therapy Treatment Patient Details Name: Heather Holt MRN: [id number] DOB: May 03, 1930 Today's Date: 12/06/2023   History of Present Illness Heather Holt is a 89 yo female who presented 10/12 after friends called for a well check. Pt was found on her bed with R gaze and L weakness. S/p R MCA thrombectomy 10/12. MRI showed R MCA infarct. ETT 10/12 - 10/15. PMHx: CKD, depression, persistent afib, GERD, HTN, osteoarthritis, vitamin D  deficiency    PT Comments  The pt is making gradual functional progress. She was more alert and verbal today, following commands with her R extremities >90% of the time. She displayed poor problem-solving though, needing max multi-modal cues to sequence all mobility along with extra time for initiation. She was able to visually track better to the L, passing midline momentarily by ~10' a couple times. However, she continues to display poor L attention. She did not actively move her L extremities except her L hamstrings did activate, whether due to increased flexor tone or to actively resist knee extension ROM provided by PT due to reported L knee pain. Requested prevalon boot for L foot. She progressed to needing maxA to transition supine to sit EOB and to come to a half stand intermittently. Her sitting balance also improved to CGA-minA. She remains at high risk for falls. Will continue to follow acutely.    If plan is discharge home, recommend the following: Two people to help with walking and/or transfers;Two people to help with bathing/dressing/bathroom;Assistance with cooking/housework;Assistance with feeding;Direct supervision/assist for medications management;Direct supervision/assist for financial management;Assist for transportation;Help with stairs or ramp for entrance;Supervision due to cognitive status   Can travel by private vehicle        Equipment Recommendations  Other (comment) (TBD)    Recommendations for Other Services Rehab consult      Precautions / Restrictions Precautions Precautions: Fall;Other (comment) Recall of Precautions/Restrictions: Impaired Precaution/Restrictions Comments: SBP < 180 Restrictions Weight Bearing Restrictions Per Provider Order: No     Mobility  Bed Mobility Overal bed mobility: Needs Assistance Bed Mobility: Sit to Supine, Supine to Sit     Supine to sit: Max assist, HOB elevated Sit to supine: Total assist   General bed mobility comments: Pt needed total assist to move her L leg off L EOB, flexing her L knee anytime PT attempted to assist it into extension, unsure if active resistance or increased hamstring tone. Pt able to move her R leg some to assist with transition but did assist in pulling her trunk up to sit using her R hand to pull up on therapist's hand, maxA. Total assist needed to transition sit to supine.    Transfers Overall transfer level: Needs assistance Equipment used: 1 person hand held assist Transfers: Sit to/from Stand, Bed to chair/wheelchair/BSC Sit to Stand: Total assist, Max assist     Squat pivot transfers: Total assist     General transfer comment: Bil knees blocked with pt holding onto therapist with her R UE. Poor initiation the first rep, needing total assist to stand and squat pivot to L along EOB. Pt demonstrated improved R leg initiation and R UE pulling the second rep, needing maxA to stand but total assist to squat pivot again to the L towards HOB.    Ambulation/Gait               General Gait Details: unable at this time   Optometrist  Tilt Bed    Modified Rankin (Stroke Patients Only) Modified Rankin (Stroke Patients Only) Pre-Morbid Rankin Score: Slight disability Modified Rankin: Severe disability     Balance Overall balance assessment: Needs assistance Sitting-balance support: Single extremity supported, Feet supported, No upper extremity supported Sitting balance-Leahy Scale:  Fair Sitting balance - Comments: Pt with brief moments of CGA for safety, up to ~15 sec durations, often needing minA for static and dynamic sitting balance. Postural control: Left lateral lean Standing balance support: Bilateral upper extremity supported Standing balance-Leahy Scale: Zero Standing balance comment: Max-total assist to come to half stand with R UE support and bil knee block                            Communication Communication Communication: Impaired Factors Affecting Communication: Reduced clarity of speech;Difficulty expressing self  Cognition Arousal: Alert Behavior During Therapy: Flat affect   PT - Cognitive impairments: Attention, Initiation, Sequencing, Orientation, Awareness, Memory, Problem solving, Safety/Judgement   Orientation impairments: Time, Person                   PT - Cognition Comments: Pt able to state her name and that she was in a hospital due to a stroke. Did not ask date. When asked DOB, pt seemed to state a home address instead, and when provided options to choose month and year of her birth, pt chose correct month but incorrect year. Pt with L inattention, needing max cues to track to and ~10' past midline to the L, only sustaining it briefly. Poor awareness of her L weakness and directional lean impacting her safety. Increased time following multi-modal simple cues with her R extremities ( > 90% of time). Following commands: Impaired Following commands impaired: Follows one step commands with increased time, Follows one step commands inconsistently    Cueing Cueing Techniques: Verbal cues, Tactile cues, Visual cues  Exercises      General Comments General comments (skin integrity, edema, etc.): VSS on RA      Pertinent Vitals/Pain Pain Assessment Pain Assessment: Faces Faces Pain Scale: Hurts little more Pain Location: L knee with extension Pain Descriptors / Indicators: Discomfort, Grimacing, Guarding, Sore,  Moaning Pain Intervention(s): Limited activity within patient's tolerance, Monitored during session, Repositioned    Home Living                          Prior Function            PT Goals (current goals can now be found in the care plan section) Acute Rehab PT Goals Patient Stated Goal: to wash her face PT Goal Formulation: With patient Time For Goal Achievement: 12/19/23 Potential to Achieve Goals: Fair Progress towards PT goals: Progressing toward goals    Frequency    Min 3X/week      PT Plan      Co-evaluation              AM-PAC PT 6 Clicks Mobility   Outcome Measure  Help needed turning from your back to your side while in a flat bed without using bedrails?: Total Help needed moving from lying on your back to sitting on the side of a flat bed without using bedrails?: A Lot Help needed moving to and from a bed to a chair (including a wheelchair)?: Total Help needed standing up from a chair using your arms (e.g., wheelchair or bedside chair)?: Total Help  needed to walk in hospital room?: Total Help needed climbing 3-5 steps with a railing? : Total 6 Click Score: 7    End of Session   Activity Tolerance: Patient tolerated treatment well Patient left: in bed;with call bell/phone within reach;with bed alarm set Nurse Communication: Mobility status PT Visit Diagnosis: Unsteadiness on feet (R26.81);Muscle weakness (generalized) (M62.81);Difficulty in walking, not elsewhere classified (R26.2);Other symptoms and signs involving the nervous system (R29.898);Hemiplegia and hemiparesis Hemiplegia - Right/Left: Left Hemiplegia - caused by: Cerebral infarction     Time: 8684-8665 PT Time Calculation (min) (ACUTE ONLY): 19 min  Charges:    $Therapeutic Activity: 8-22 mins PT General Charges $$ ACUTE PT VISIT: 1 Visit                     Theo Ferretti, PT, DPT Acute Rehabilitation Services  Office: [phone]    Theo CHRISTELLA Ferretti 12/06/2023,  2:18 PM

## 2023-12-06 NOTE — Progress Notes (Addendum)
 STROKE TEAM PROGRESS NOTE    SIGNIFICANT HOSPITAL EVENTS 10/12- Code stroke, IR 10/15-patient extubated  INTERIM HISTORY/SUBJECTIVE Passed MBS today, resume PO meds crushed in applesauce. Neuro exam stable, afebrile.   OBJECTIVE  CBC    Component Value Date/Time   WBC 6.9 12/06/2023 0518   RBC 4.35 12/06/2023 0518   HGB 12.1 12/06/2023 0518   HCT 38.5 12/06/2023 0518   PLT 162 12/06/2023 0518   MCV 88.5 12/06/2023 0518   MCH 27.8 12/06/2023 0518   MCHC 31.4 12/06/2023 0518   RDW 14.4 12/06/2023 0518   LYMPHSABS 0.6 (L) 12/01/2023 1128   MONOABS 0.2 12/01/2023 1128   EOSABS 0.0 12/01/2023 1128   BASOSABS 0.0 12/01/2023 1128    BMET    Component Value Date/Time   NA 144 12/06/2023 0518   K 4.0 12/06/2023 0518   CL 110 12/06/2023 0518   CO2 22 12/06/2023 0518   GLUCOSE 104 (H) 12/06/2023 0518   BUN 23 12/06/2023 0518   CREATININE 1.01 (H) 12/06/2023 0518   CALCIUM 9.1 12/06/2023 0518   GFRNONAA 53 (L) 12/06/2023 0518    IMAGING past 24 hours No results found.   Vitals:   12/06/23 0530 12/06/23 0600 12/06/23 0700 12/06/23 0739  BP: (!) 168/103 (!) 180/90 (!) 165/113   Pulse: 96 83 91   Resp: 19 (!) 37 (!) 32   Temp:    99.4 F (37.4 C)  TempSrc:    Axillary  SpO2: 95% 95% 96%   Weight:      Height:          Physical Exam  Constitutional: Ill-appearing elderly patient in no acute distress Psych: Affect blunted Eyes: No scleral injection.  HENT: ETT in place Head: Normocephalic.  Cardiovascular: A-fib on the monitor Respiratory: Regular, unlabored respirations on supplemental O2 Skin: WDI.   Neuro: Mental Status: Patient is drowsy but opens eyes to voice and is able to state name, location and correct month and year.  Left facial droop noted, able to track examiner around the room she moves her right upper extremity to command with antigravity strength and follows commands with right lower extremity.  No movement of left upper extremity noted, and  some withdrawal of left lower extremity to noxious.    ASSESSMENT/PLAN  [name] is a 89 y.o. female with history of CKD, depression, persistent afib, GERD, HTN, osteoarthritis, vitamin D  deficiency presenting as a code stroke via EMS.  NIH on Admission 25  Stroke:  right large MCA territory infarct with confluent petechial hemorrhage due to right M1 occlusion s/p IR with TICI2b, etiology: Likely Afib not on AC   CT Head Acute right MCA territory infarct.  ASPECTS 6/10 No hyperdense vessel. Atherosclerotic calcifications in the cavernous carotid arteries bilaterally.  CT angio Head and Neck Distal right M1 occlusion with poor collateralization of the anterior and superior divisions. CTP core infarct of 13 ml and larger area of ischemia in the right MCA territory measuring 94 ml.  However core infarct was underestimated due to pseudonormalization MRI  Acute large infarct in the right MCA territory with associated edema and slightly increased mass effect on the right lateral ventricle without significant midline shift. Heterogeneous areas of susceptibility in the right MCA territory concerning for petechial hemorrhage.  MRA  Interval recanalization of the M1 segment of the right MCA compared to the recent prior CTA. The MCA and distal branches appear patent without evidence of high-grade stenosis. CT repeat 10/14 continued evolution of right MCA  territory infarct with stable superimposed IPH, mass effect but no significant MLS 2D Echo 60 to 65%, LA severely dilated  LDL 61 HgbA1c 5.5 VTE prophylaxis - SCDs No antithrombotic prior to admission, now on ASA 81 given the confluent petechial hemorrhage. Will consider DOAC around day 7 (early next week) Therapy recommendations:  CIR Disposition: Pending, palliative care on board, no reintubation, no trach, DNR  Atrial fibrillation Home Meds: prior auth for pradaxa was denied, does not appear to have been taking any other anticoagulation.   Continue telemetry monitoring Rate controlled so far On ASA 81. Will consider DOAC around day 7 given the confluent petechial hemorrhage  Respiratory insuffiency post procedure  CCM following Remained Intubated post procedure Patient extubated 10/15 Tolerating well so far  Hypertension Home meds:  Coreg 25mg , hydralazine  100 mg 3 times daily, furosemide 20 mg daily Stable now off cleviprex Blood Pressure Goal: SBP less than 160  Long-term BP goal normotensive  Hyperlipidemia Home meds: Atorvastatin 20mg  LDL 61, goal < 70 Resume lipitor once po access Continue statin at discharge  Dysphagia  Post stroke dysphagia Speech on board On gentle IVF 10/17-passed MBS -puree and nectar thick liquid, crushed meds Advance diet as able  Other stroke risk factors Advanced age  Other active problems CKD Stage 3a, Cr 1.4-1.15-1.44-1.27-1.30- 1.10 - 1.01, GFR 47 -53 Hypokalemia, Replaced GERD, on PPI   Hospital day # 5  Patient seen and examined by NP/APP with MD. MD to update note as needed.   Heather Last, DNP, FNP-BC [facility] Pager: [phone]  ATTENDING NOTE: I reviewed above note and agree with the assessment and plan. Pt was seen and examined.   No family at bedside.  Patient lethargic, however eyes open to voice, orientated to age, place and month, not to time.  Follow simple commands on the right hand and foot, still has left hemiplegia.  Right gaze preference, able to cross midline.  Right facial droop.  Passed swallow today, on dysphagia 1 and nectar thick liquid.  Palliative is on board, no trach, no reintubation and DNR status.  For detailed assessment and plan, please refer to above as I have made changes wherever appropriate.   Heather Cummins, MD PhD Stroke Neurology 12/06/2023 7:08 PM  This patient is critically ill due to large right MCA stroke, acute petechial hemorrhage, status post thrombectomy, cerebral edema, A-fib not on AC and at  significant risk of neurological worsening, death form recurrent stroke, hemorrhagic termination, brain herniation, heart failure, seizure. This patient's care requires constant monitoring of vital signs, hemodynamics, respiratory and cardiac monitoring, review of multiple databases, neurological assessment, discussion with family, other specialists and medical decision making of high complexity. I spent 35 minutes of neurocritical care time in the care of this patient.    To contact Stroke Continuity provider, please refer to [email or web address]. After hours, contact General Neurology

## 2023-12-06 NOTE — Progress Notes (Signed)
 Daily Progress Note   Patient Name: Texanna Hilburn       Date: 12/06/2023 DOB: 16-May-1930  Age: 89 y.o. MRN#: [id number] Attending Physician: Stroke, Md, MD Primary Care Physician: Godwin Shed, MD Admit Date: 12/01/2023  Reason for Consultation/Follow-up: Establishing goals of care  Subjective: Patient minimally interactive, eyes closed, grunts to my voice but nothing intelligible during my interaction with her  Length of Stay: 5  Current Medications: Scheduled Meds:   [START ON 12/07/2023] aspirin  81 mg Oral Daily   atorvastatin  20 mg Oral Daily   carvedilol  25 mg Oral BID WC   Chlorhexidine  Gluconate Cloth  6 each Topical Q0600   heparin injection (subcutaneous)  5,000 Units Subcutaneous Q8H   hydrALAZINE   100 mg Oral TID   mouth rinse  15 mL Mouth Rinse 4 times per day   [START ON 12/07/2023] pantoprazole   40 mg Oral Q1200   polyethylene glycol  17 g Oral BID   senna-docusate  1 tablet Oral BID   sertraline  25 mg Oral Daily   torsemide  20 mg Oral Daily    Continuous Infusions:  sodium chloride  40 mL/hr at 12/06/23 0700   clevidipine      PRN Meds: acetaminophen  **OR** acetaminophen  (TYLENOL ) oral liquid 160 mg/5 mL **OR** acetaminophen , hydrALAZINE , ondansetron  (ZOFRAN ) IV, mouth rinse, senna-docusate  Physical Exam Constitutional:      General: She is not in acute distress.    Appearance: She is ill-appearing.     Comments: lethargic  Pulmonary:     Effort: Pulmonary effort is normal.  Skin:    General: Skin is warm and dry.  Neurological:     Comments: Minimally verbal             Vital Signs: BP (!) 134/96   Pulse 100   Temp 98.7 F (37.1 C) (Axillary)   Resp 18   Ht 5' 9 (1.753 m)   Wt 80 kg   SpO2 94%   BMI 26.05 kg/m  SpO2: SpO2: 94 % O2 Device: O2  Device: Room Air O2 Flow Rate: O2 Flow Rate (L/min): 1 L/min  Intake/output summary:  Intake/Output Summary (Last 24 hours) at 12/06/2023 1400 Last data filed at 12/06/2023 0900 Gross per 24 hour  Intake 635.56 ml  Output 1125 ml  Net -489.44 ml   LBM: Last BM Date :  (PTA) Baseline Weight: Weight: 80 kg Most recent weight: Weight: 80 kg       Palliative Assessment/Data: PPS 20%      Patient Active Problem List   Diagnosis Date Noted   On mechanically assisted ventilation (HCC) 12/04/2023   Paroxysmal atrial fibrillation (HCC) 12/04/2023   Stroke (HCC) 12/01/2023   Acute right MCA stroke (HCC) 12/01/2023   Hypertensive emergency 04/30/2016   Headache 04/30/2016   CKD (chronic kidney disease)    Acute nonintractable headache    Primary localized osteoarthrosis of the knee, left    Radicular syndrome of left leg    Osteoarthritis of lumbar spine    Vitamin D  deficiency    GERD (gastroesophageal reflux disease)    Depression    Pre-operative cardiovascular examination 03/16/2016   Essential hypertension, benign 03/16/2016  Abnormal EKG 03/16/2016    Palliative Care Assessment & Plan   HPI: 89 y.o. female  with past medical history of CKD, depression, A-fib, GERD, and hypertension admitted on 12/01/2023 with acute right MCA stroke.  Intubated for airway protection. Status post IR thrombectomy and stent placement. Extubated with plan to not reintubate, transition to comfort is not doing well s/p extubation. Patient has tolerated extubation so far. PMT consulted to discuss GOC. SLP eval pending.   Assessment: Follow up today with patient. No family at the bedside during my visits x2. Called son - no answer - voicemail left with callback number.  Received report from [place] SLP from [facility] - patient did better than anticipated and was started on modified diet. At this point, avoiding cortrak. Concern will be how much she will be able to take in, if she will be able to sustain  herself orally.  PMT will continue to follow and meet with family.   Recommendations/Plan: PMT following along, modified diet has been started, concern about how much she can take in No reintubation, no trach, no peg Will f/u 10/18  Code Status: DNR  Discharge Planning: To Be Determined  Care plan was discussed with SLP and RN  Thank you for allowing the Palliative Medicine Team to assist in the care of this patient.   Total Time 35 minutes Prolonged Time Billed  no   Time spent includes: Detailed review of medical records (labs, imaging, vital signs), medically appropriate exam, discussion with treatment team, counseling and educating patient, family and/or staff, documenting clinical information, medication management and coordination of care.     *Please note that this is a verbal dictation therefore any spelling or grammatical errors are due to the Dragon Medical One system interpretation.  Tobey Jama Barnacle, DNP, [facility] Palliative Medicine Team Team Phone # [phone]  Pager [phone]

## 2023-12-07 DIAGNOSIS — I63511 Cerebral infarction due to unspecified occlusion or stenosis of right middle cerebral artery: Secondary | ICD-10-CM | POA: Diagnosis not present

## 2023-12-07 DIAGNOSIS — R627 Adult failure to thrive: Secondary | ICD-10-CM | POA: Diagnosis not present

## 2023-12-07 DIAGNOSIS — Z7189 Other specified counseling: Secondary | ICD-10-CM | POA: Diagnosis not present

## 2023-12-07 DIAGNOSIS — R233 Spontaneous ecchymoses: Secondary | ICD-10-CM | POA: Diagnosis not present

## 2023-12-07 DIAGNOSIS — I4819 Other persistent atrial fibrillation: Secondary | ICD-10-CM | POA: Diagnosis not present

## 2023-12-07 DIAGNOSIS — I639 Cerebral infarction, unspecified: Secondary | ICD-10-CM | POA: Diagnosis not present

## 2023-12-07 DIAGNOSIS — Z515 Encounter for palliative care: Secondary | ICD-10-CM | POA: Diagnosis not present

## 2023-12-07 DIAGNOSIS — E785 Hyperlipidemia, unspecified: Secondary | ICD-10-CM | POA: Diagnosis not present

## 2023-12-07 DIAGNOSIS — I998 Other disorder of circulatory system: Secondary | ICD-10-CM

## 2023-12-07 LAB — BASIC METABOLIC PANEL WITH GFR
Anion gap: 14 (ref 5–15)
BUN: 27 mg/dL — ABNORMAL HIGH (ref 8–23)
CO2: 21 mmol/L — ABNORMAL LOW (ref 22–32)
Calcium: 9.1 mg/dL (ref 8.9–10.3)
Chloride: 112 mmol/L — ABNORMAL HIGH (ref 98–111)
Creatinine, Ser: 1.09 mg/dL — ABNORMAL HIGH (ref 0.44–1.00)
GFR, Estimated: 48 mL/min — ABNORMAL LOW (ref >60)
Glucose, Bld: 111 mg/dL — ABNORMAL HIGH (ref 70–99)
Potassium: 3.6 mmol/L (ref 3.5–5.1)
Sodium: 147 mmol/L — ABNORMAL HIGH (ref 135–145)

## 2023-12-07 LAB — CBC
HCT: 40.6 % (ref 36.0–46.0)
Hemoglobin: 12.6 g/dL (ref 12.0–15.0)
MCH: 27.8 pg (ref 26.0–34.0)
MCHC: 31 g/dL (ref 30.0–36.0)
MCV: 89.4 fL (ref 80.0–100.0)
Platelets: 176 K/uL (ref 150–400)
RBC: 4.54 MIL/uL (ref 3.87–5.11)
RDW: 14.6 % (ref 11.5–15.5)
WBC: 7 K/uL (ref 4.0–10.5)
nRBC: 0 % (ref 0.0–0.2)

## 2023-12-07 LAB — MAGNESIUM: Magnesium: 2.3 mg/dL (ref 1.7–2.4)

## 2023-12-07 MED ORDER — MORPHINE SULFATE (PF) 2 MG/ML IV SOLN
1.0000 mg | INTRAVENOUS | Status: DC | PRN
  Administered 2023-12-07 – 2023-12-08 (×3): 1 mg via INTRAVENOUS
  Filled 2023-12-07 (×3): qty 1

## 2023-12-07 MED ORDER — LABETALOL HCL 5 MG/ML IV SOLN
INTRAVENOUS | Status: AC
  Filled 2023-12-07: qty 4

## 2023-12-07 MED ORDER — GLYCOPYRROLATE 1 MG PO TABS
1.0000 mg | ORAL_TABLET | ORAL | Status: DC | PRN
Start: 2023-12-07 — End: 2023-12-09

## 2023-12-07 MED ORDER — GLYCOPYRROLATE 0.2 MG/ML IJ SOLN: 0.2000 mg | INTRAMUSCULAR | Status: DC | PRN

## 2023-12-07 MED ORDER — CARVEDILOL 12.5 MG PO TABS: 12.5000 mg | ORAL_TABLET | Freq: Two times a day (BID) | ORAL | Status: DC

## 2023-12-07 MED ORDER — HYDRALAZINE HCL 20 MG/ML IJ SOLN: 5.0000 mg | INTRAMUSCULAR | Status: DC | PRN

## 2023-12-07 MED ORDER — BIOTENE DRY MOUTH MT LIQD: 15.0000 mL | OROMUCOSAL | Status: DC | PRN

## 2023-12-07 MED ORDER — HYDRALAZINE HCL 20 MG/ML IJ SOLN: 20.0000 mg | INTRAMUSCULAR | Status: DC | PRN

## 2023-12-07 MED ORDER — LORAZEPAM 2 MG/ML IJ SOLN: 0.5000 mg | INTRAMUSCULAR | Status: DC | PRN

## 2023-12-07 MED ORDER — CHLORHEXIDINE GLUCONATE CLOTH 2 % EX PADS
6.0000 | MEDICATED_PAD | Freq: Every day | CUTANEOUS | Status: DC
  Administered 2023-12-07: 6 via TOPICAL

## 2023-12-07 MED ORDER — FUROSEMIDE 10 MG/ML IJ SOLN
20.0000 mg | Freq: Once | INTRAMUSCULAR | Status: AC
  Administered 2023-12-07: 20 mg via INTRAVENOUS
  Filled 2023-12-07: qty 2

## 2023-12-07 MED ORDER — POTASSIUM CHLORIDE 20 MEQ PO PACK
40.0000 meq | PACK | Freq: Once | ORAL | Status: AC
  Administered 2023-12-07: 40 meq via ORAL
  Filled 2023-12-07: qty 2

## 2023-12-07 MED ORDER — LISINOPRIL 10 MG PO TABS: 5.0000 mg | ORAL_TABLET | Freq: Every day | ORAL | Status: DC

## 2023-12-07 MED ORDER — LABETALOL HCL 5 MG/ML IV SOLN
10.0000 mg | Freq: Once | INTRAVENOUS | Status: AC
  Administered 2023-12-07: 10 mg via INTRAVENOUS

## 2023-12-07 MED ADMIN — Aspirin Chew Tab 81 MG: 81 mg | ORAL | NDC 00904679480

## 2023-12-07 NOTE — Progress Notes (Addendum)
 STROKE TEAM PROGRESS NOTE    SIGNIFICANT HOSPITAL EVENTS 10/12- Code stroke, IR 10/15-patient extubated 10/17- MBS passed, can have meds crushed   INTERIM HISTORY/SUBJECTIVE Received one dose of lasix overnight  Bp labile this morning. Neuro exam stable. Hydralazine  cut in half and hold parameters added.  Neuro exam stable   OBJECTIVE   Basic Metabolic Panel: Recent Labs  Lab 12/03/23 0259 12/03/23 1506 12/04/23 0858 12/05/23 0547 12/06/23 0518 12/07/23 0545  NA 141 138 140 143 144 147*  K 4.3 3.9 3.8 3.6 4.0 3.6  CL 109 102 106 107 110 112*  CO2 22 22 22 23 22  21*  GLUCOSE 114* 139* 124* 105* 104* 111*  BUN 18 21 24* 23 23 27*  CREATININE 1.44* 1.27* 1.30* 1.10* 1.01* 1.09*  CALCIUM 8.9 9.2 9.0 9.2 9.1 9.1  MG 2.7*  --  2.2 2.3 2.3 2.3  PHOS  --   --   --  4.0  --   --     CBC: Recent Labs  Lab 12/01/23 1128 12/01/23 1135 12/03/23 0259 12/04/23 0858 12/05/23 0547 12/06/23 0518 12/07/23 0545  WBC 4.5   < > 9.1 8.8 8.1 6.9 7.0  NEUTROABS 3.6  --   --   --   --   --   --   HGB 12.3   < > 10.9* 12.3 12.4 12.1 12.6  HCT 39.5   < > 34.5* 38.2 39.9 38.5 40.6  MCV 89.0   < > 88.2 87.0 88.9 88.5 89.4  PLT 158   < > 134* 162 153 162 176   < > = values in this interval not displayed.    Coagulation Studies: No results for input(s): LABPROT, INR in the last 72 hours.    IMAGING past 24 hours DG Swallowing Func-Speech Pathology Result Date: 12/06/2023 Table formatting from the original result was not included. Modified Barium Swallow Study Patient Details Name: Heather Holt MRN: [id number] Date of Birth: 1930/05/09 Today's Date: 12/06/2023 HPI/PMH: HPI: [name] is a 89 y.o. female with history of CKD, depression, persistent afib, GERD, HTN, osteoarthritis, vitamin D  deficiency presenting as a code stroke via EMS.  NIH on Admission 25. Found to have right large MCA territory infarct with confluent petechial hemorrhage due to right M1 occlusion s/p IR  with TICI2b and underwent RMCA thrombextomy 10/12. Intubated 10/12-10/15. Clinical Impression: Clinical Impression: Pt presents with moderate oropharyngeal dysphagia and suspected esophageal involvement. Pt unable to achieve labial closure with majority of bolus falling anteriorally from oral cavity with cup facilitated by slightly reclined posture and teaspoon presentations. Oral transport was slow and disorganized with lingual residue spilling into pharynx. Solid not administered. Laryngeal penetration and aspiration due to multifactorial components of decreased oral control/clearance, partial laryngeal elevation, incomplete glottal closure and pharyngeal residue.   Thin spilled from oral cavity onto vocal cords with trace silent aspiration during the swallow (PAS 8). Pyriform sinus residue from nectar penerated cords during the swallow (PAS 5), then was silently aspirated during subsequent swallows (PAS 8). Further instances of penetration with nectar (PAS 3) observed with return of fluoro presumed from lingual residuals. Cues for hard coughs were effective clearing penetrates and aspirated material was not detected and assumed to be ejected however unable to confirm. There was intermitent ascension of barium from PES due to suspected premature closure of PES. In addition esophageal scan revealed significant residue mid esophagus. Recommend Dys 1, nectar thick via teaspoon, hard coughs during swallow, monitor for oral residue, crush pills,  full assist and supervision and remain upright after meals. There is continued mild aspiration risk which was discussed with neurologist and Palliative care NP and pt's son and daughter-in-law who were in agreement with recommendations. Provided further education re: recommendations with family. ST will continue to follow. Factors that may increase risk of adverse event in presence of aspiration [name] & [name] 2021): Factors that may increase risk of adverse event in  presence of aspiration [name] & [name] 2021): Reduced cognitive function; Frail or deconditioned; Inadequate oral hygiene; Dependence for feeding and/or oral hygiene; Limited mobility Recommendations/Plan: Swallowing Evaluation Recommendations Swallowing Evaluation Recommendations Recommendations: PO diet PO Diet Recommendation: Dysphagia 1 (Pureed); Mildly thick liquids (Level 2, nectar thick) Liquid Administration via: Spoon Medication Administration: Crushed with puree Supervision: Full assist for feeding; Full supervision/cueing for swallowing strategies Swallowing strategies  : Slow rate; Small bites/sips; Check for pocketing or oral holding; Check for anterior loss; Hard cough after swallowing; Minimize environmental distractions Postural changes: Position pt fully upright for meals; Stay upright 30-60 min after meals Oral care recommendations: Oral care BID (2x/day) Caregiver Recommendations: Avoid jello, ice cream, thin soups, popsicles; Have oral suction available Treatment Plan Treatment Plan Treatment recommendations: Therapy as outlined in treatment plan below Follow-up recommendations: Acute inpatient rehab (3 hours/day) Functional status assessment: Patient has had a recent decline in their functional status and demonstrates the ability to make significant improvements in function in a reasonable and predictable amount of time. Treatment frequency: Min 2x/week Treatment duration: 2 weeks Interventions: Aspiration precaution training; Compensatory techniques; Patient/family education; Trials of upgraded texture/liquids; Diet toleration management by SLP Recommendations Recommendations for follow up therapy are one component of a multi-disciplinary discharge planning process, led by the attending physician.  Recommendations may be updated based on patient status, additional functional criteria and insurance authorization. Assessment: Orofacial Exam: Orofacial Exam Oral Cavity: Oral Hygiene: Pooled  secretions (MILD) Oral Cavity - Dentition: Missing dentition; Edentulous (DENTURES NOT DONNED) Orofacial Anatomy: Other (comment) Oral Motor/Sensory Function: Suspected cranial nerve impairment CN V - Trigeminal: Not tested CN VII - Facial: Left motor impairment CN IX - Glossopharyngeal, CN X - Vagus: Not tested CN XII - Hypoglossal: Left motor impairment Anatomy: Anatomy: Suspected cervical osteophytes Boluses Administered: Boluses Administered Boluses Administered: Thin liquids (Level 0); Mildly thick liquids (Level 2, nectar thick); Moderately thick liquids (Level 3, honey thick); Puree  Oral Impairment Domain: Oral Impairment Domain Lip Closure: -- (unable to elicit labial closure) Tongue control during bolus hold: Escape to lateral buccal cavity/floor of mouth Bolus preparation/mastication: -- (not given solid) Bolus transport/lingual motion: Repetitive/disorganized tongue motion Oral residue: Residue collection on oral structures Location of oral residue : Tongue Initiation of pharyngeal swallow : Posterior laryngeal surface of the epiglottis; Pyriform sinuses  Pharyngeal Impairment Domain: Pharyngeal Impairment Domain Soft palate elevation: No bolus between soft palate (SP)/pharyngeal wall (PW) Laryngeal elevation: Partial superior movement of thyroid cartilage/partial approximation of arytenoids to epiglottic petiole Anterior hyoid excursion: Complete anterior movement Epiglottic movement: Complete inversion Laryngeal vestibule closure: Incomplete, narrow column air/contrast in laryngeal vestibule Pharyngeal stripping wave : Present - complete Pharyngeal contraction (A/P view only): N/A Pharyngoesophageal segment opening: Partial distention/partial duration, partial obstruction of flow Tongue base retraction: Trace column of contrast or air between tongue base and PPW Pharyngeal residue: Collection of residue within or on pharyngeal structures Location of pharyngeal residue: Tongue base; Valleculae; Pyriform  sinuses  Esophageal Impairment Domain: Esophageal Impairment Domain Esophageal clearance upright position: Esophageal retention (significant in mid esophagus) Pill: No data recorded Penetration/Aspiration Scale  Score: Penetration/Aspiration Scale Score 1.  Material does not enter airway: Puree; Moderately thick liquids (Level 3, honey thick) 2.  Material enters airway, remains ABOVE vocal cords then ejected out: Mildly thick liquids (Level 2, nectar thick) 5.  Material enters airway, CONTACTS cords and not ejected out: Mildly thick liquids (Level 2, nectar thick) 8.  Material enters airway, passes BELOW cords without attempt by patient to eject out (silent aspiration) : Thin liquids (Level 0); Mildly thick liquids (Level 2, nectar thick) Compensatory Strategies: Compensatory Strategies Compensatory strategies: Yes Reclining posture: Effective Effective Reclining Posture: Mildly thick liquid (Level 2, nectar thick) (assisted in decreased oral spill) Other(comment): Effective (hard cough)   General Information: Caregiver present: No  Diet Prior to this Study: NPO (ice chips)   Temperature : Normal   Respiratory Status: WFL   Supplemental O2: None (Room air)   History of Recent Intubation: Yes  Behavior/Cognition: Cooperative; Pleasant mood; Other (Comment) (awake, intermittent cues for increased arousal) Self-Feeding Abilities: Dependent for feeding Baseline vocal quality/speech: Hypophonia/low volume Volitional Cough: -- (moderate) Volitional Swallow: Unable to elicit (attempted but unable) Exam Limitations: No limitations Goal Planning: Prognosis for improved oropharyngeal function: Fair Barriers to Reach Goals: Cognitive deficits; Severity of deficits No data recorded No data recorded Consulted and agree with results and recommendations: Family member/caregiver; Pt unable/family or caregiver not available Pain: Pain Assessment Pain Assessment: Faces Faces Pain Scale: 4 Facial Expression: 0 Body Movements: 0 Muscle  Tension: 0 Compliance with ventilator (intubated pts.): N/A Vocalization (extubated pts.): 0 CPOT Total: 0 Pain Location: L knee with extension Pain Descriptors / Indicators: Discomfort; Grimacing; Guarding; Sore; Moaning Pain Intervention(s): Limited activity within patient's tolerance; Monitored during session; Repositioned End of Session: Start Time:SLP Start Time (ACUTE ONLY): 1243 Stop Time: SLP Stop Time (ACUTE ONLY): 1300 Time Calculation:SLP Time Calculation (min) (ACUTE ONLY): 17 min Charges: SLP Evaluations $ SLP Speech Visit: 1 Visit SLP Evaluations $BSS Swallow: 1 Procedure $MBS Swallow: 1 Procedure SLP visit diagnosis: SLP Visit Diagnosis: Dysphagia, oropharyngeal phase (R13.12); Dysphagia, pharyngoesophageal phase (R13.14) Past Medical History: Past Medical History: Diagnosis Date  CKD (chronic kidney disease)   Depression   GERD (gastroesophageal reflux disease)   History of kidney stones   Hypertension   Osteoarthritis of lumbar spine   Primary localized osteoarthrosis of the knee, left   Radicular syndrome of left leg   Vitamin D  deficiency  Past Surgical History: Past Surgical History: Procedure Laterality Date  BACK SURGERY  1969  COLONOSCOPY    EYE SURGERY Bilateral   cataract surgery  RADIOLOGY WITH ANESTHESIA N/A 12/01/2023  Procedure: RADIOLOGY WITH ANESTHESIA;  Surgeon: Radiologist, Medication, MD;  Location: [facility] OR;  Service: Radiology;  Laterality: N/A; Dustin Olam Bull 12/06/2023, 5:16 PM    Vitals:   12/07/23 0715 12/07/23 0730 12/07/23 0733 12/07/23 0745  BP: (!) 177/86 (!) 163/87 (!) 184/86 (!) 172/96  Pulse: 90 91 94 95  Resp: 18 (!) 22  20  Temp:    98.4 F (36.9 C)  TempSrc:    Oral  SpO2: 97% 93%  97%  Weight:      Height:          Physical Exam  Constitutional: Ill-appearing elderly patient in no acute distress Psych: Affect blunted Eyes: No scleral injection.  HENT: Tongue and mucus membranes are dry  Head: Normocephalic.  Cardiovascular: A-fib on the  monitor Respiratory: Regular, unlabored respirations on room air  Skin: WDI.   Neuro: Mental Status: Patient is drowsy but opens eyes to  voice and is able to state name, requires repeated stimulation to follow commands    Left facial droop noted, able to track examiner around the room (does not cross midline) She moves her right upper extremity to command with antigravity strength and follows commands with right lower extremity Left upper extremity flaccid Left lower extremity withdraws to pain, appears to have triple flexion    ASSESSMENT/PLAN  [name] is a 89 y.o. female with history of CKD, depression, persistent afib, GERD, HTN, osteoarthritis, vitamin D  deficiency presenting as a code stroke via EMS.  NIH on Admission 25  Stroke:  right large MCA territory infarct with confluent petechial hemorrhage due to right M1 occlusion s/p IR with TICI2b, etiology: Likely Afib not on AC   CT Head Acute right MCA territory infarct.  ASPECTS 6/10 No hyperdense vessel. Atherosclerotic calcifications in the cavernous carotid arteries bilaterally.  CT angio Head and Neck Distal right M1 occlusion with poor collateralization of the anterior and superior divisions. CTP core infarct of 13 ml and larger area of ischemia in the right MCA territory measuring 94 ml.  However core infarct was underestimated due to pseudonormalization MRI  Acute large infarct in the right MCA territory with associated edema and slightly increased mass effect on the right lateral ventricle without significant midline shift. Heterogeneous areas of susceptibility in the right MCA territory concerning for petechial hemorrhage.  MRA  Interval recanalization of the M1 segment of the right MCA compared to the recent prior CTA. The MCA and distal branches appear patent without evidence of high-grade stenosis. CT repeat 10/14 continued evolution of right MCA territory infarct with stable superimposed IPH, mass effect but no  significant MLS 2D Echo 60 to 65%, LA severely dilated  LDL 61 HgbA1c 5.5 VTE prophylaxis - SCDs No antithrombotic prior to admission, now on ASA 81 given the confluent petechial hemorrhage. Will consider DOAC around day 7 (early next week) Therapy recommendations:  CIR Disposition: Pending, palliative care on board, no reintubation, no trach, DNR  Atrial fibrillation Home Meds: prior auth for pradaxa was denied, does not appear to have been taking any other anticoagulation.  Continue telemetry monitoring Rate controlled so far On ASA 81. Will consider DOAC around day 7 given the confluent petechial hemorrhage  Respiratory insuffiency post procedure  CCM following Remained Intubated post procedure Patient extubated 10/15 Tolerating well so far  Hypertension, labile today Home meds:  Coreg 25mg , hydralazine  100 mg 3 times daily reduce to 50 mg today, demadex (torsemide) 20 mg daily PRN labetalol and hydralazine , hydralizine reduce to 5 mg from 10 Blood Pressure Goal: SBP less than 160  Long-term BP goal normotensive  Hyperlipidemia Home meds: Atorvastatin 20mg  LDL 61, goal < 70 Resume lipitor once po access Continue statin at discharge  Dysphagia  Post stroke dysphagia Speech on board On gentle IVF 10/17-passed MBS -puree and nectar thick liquid, crushed meds Advance diet as able  Other stroke risk factors Advanced age  Other active problems CKD Stage 3a, Cr 1.4-1.15-1.44-1.27-1.30- 1.10 - 1.01, GFR 47 -53 Hypokalemia, Replaced GERD, on PPI   Hospital day # 6  Patient seen and examined by NP/APP with MD. MD to update note as needed.   Jorene Last, DNP, FNP-BC [facility] Pager: [phone]  To contact Stroke Continuity provider, please refer to [email or web address]. After hours, contact General Neurology  Attending Neurologist's note:  Exam stable as above, needs continued ICU monitoring today due to very labile BP, will monitor carefully  I  personally saw this patient, gathering history, performing a neurologic examination, reviewing relevant labs, personally reviewing relevant imaging including last prior head CT, and formulated the assessment and plan, adding the note above for completeness and clarity to accurately reflect my thoughts  [name] MD-PhD [facility] [phone]  CRITICAL CARE Performed by: Lola LITTIE Jernigan   Total critical care time: 45 minutes  Critical care time was exclusive of separately billable procedures and treating other patients.  Critical care was necessary to treat or prevent imminent or life-threatening deterioration.  Critical care was time spent personally by me on the following activities: development of treatment plan with patient and/or surrogate as well as nursing, discussions with consultants, evaluation of patient's response to treatment, examination of patient, obtaining history from patient or surrogate, ordering and performing treatments and interventions, ordering and review of laboratory studies, ordering and review of radiographic studies, pulse oximetry and re-evaluation of patient's condition.

## 2023-12-07 NOTE — Progress Notes (Addendum)
 Notified [place] CCM, NP of (2) cycled BP's on (2) different extremities taken and BP running 80s systolic. See new medication order parameters.

## 2023-12-07 NOTE — Progress Notes (Addendum)
 This RN asked neuro team Dr. Jerrie to see if Foley is still indicated, they stated yes since she got lasix today to keep foley for strict I/O and they will re-assess tmrw.

## 2023-12-07 NOTE — Plan of Care (Signed)
 OVERNIGHT ON CALL NOTE  SBP above goal. Labetalol IV x1 given Repeat BP still high. RN reports she is edematous as well. Will order lasix 20mg  x1 Stroke team to follow  -- A. Voncile, MD Neurology

## 2023-12-07 NOTE — IPAL (Signed)
 Family meeting with son, pastor, Tobey Barnacle (palliative care NP), and another family member  We discussed that patient would strongly not want a feeding tube.  Unfortunately although she was recommended for dysphagia 1 diet, there was evidence of aspiration during her swallow study, and she is not taking in enough to sustain herself.  Family agreed that at this time transitioning to focus on comfort is appropriate  20 min additional time spent with family in goals of care discussion and in coordination with palliative care team and nursing  [name] MD-PhD [facility] [phone]

## 2023-12-07 NOTE — Progress Notes (Signed)
 This RN has called 6N RN to give report x2. 6N RN states not ready to take report.

## 2023-12-07 NOTE — Plan of Care (Signed)
  Problem: Education: Goal: Knowledge of disease or condition will improve Outcome: Progressing Goal: Knowledge of secondary prevention will improve (MUST DOCUMENT ALL) Outcome: Progressing Goal: Knowledge of patient specific risk factors will improve (DELETE if not current risk factor) Outcome: Progressing   Problem: Ischemic Stroke/TIA Tissue Perfusion: Goal: Complications of ischemic stroke/TIA will be minimized Outcome: Progressing

## 2023-12-07 NOTE — Progress Notes (Addendum)
 Dicussed persistent hypertension with DR Voncile, current BP 192/100. Given hydralazine  10mg . Plans to add amlodipine as well, however allergic. Per Dr Voncile will review and add necessary medications

## 2023-12-07 NOTE — Progress Notes (Signed)
 Daily Progress Note   Patient Name: Heather Holt       Date: 12/07/2023 DOB: 1930/06/26  Age: 89 y.o. MRN#: [id number] Attending Physician: Stroke, Md, MD Primary Care Physician: Godwin Shed, MD Admit Date: 12/01/2023  Reason for Consultation/Follow-up: Establishing goals of care  Subjective: Patient minimally interactive, eyes closed, less responsive to voice today RN reports small bites - minimal PO intake  Length of Stay: 6  Current Medications: Scheduled Meds:   aspirin  81 mg Oral Daily   atorvastatin  20 mg Oral Daily   carvedilol  25 mg Oral BID WC   Chlorhexidine  Gluconate Cloth  6 each Topical Daily   heparin injection (subcutaneous)  5,000 Units Subcutaneous Q8H   hydrALAZINE   50 mg Oral TID   mouth rinse  15 mL Mouth Rinse 4 times per day   pantoprazole   40 mg Oral Q1200   polyethylene glycol  17 g Oral BID   senna-docusate  1 tablet Oral BID   sertraline  25 mg Oral Daily   torsemide  20 mg Oral Daily    Continuous Infusions:  sodium chloride  40 mL/hr at 12/07/23 0800    PRN Meds: acetaminophen  **OR** acetaminophen  (TYLENOL ) oral liquid 160 mg/5 mL **OR** acetaminophen , hydrALAZINE , ondansetron  (ZOFRAN ) IV, mouth rinse, senna-docusate  Physical Exam Constitutional:      General: She is not in acute distress.    Appearance: She is ill-appearing.     Comments: lethargic  Pulmonary:     Effort: Pulmonary effort is normal.  Skin:    General: Skin is warm and dry.  Neurological:     Comments: Minimally verbal             Vital Signs: BP (!) 137/91   Pulse 98   Temp 98.5 F (36.9 C) (Oral)   Resp (!) 25   Ht 5' 9 (1.753 m)   Wt 80 kg   SpO2 91%   BMI 26.05 kg/m  SpO2: SpO2: 91 % O2 Device: O2 Device: Room Air O2 Flow Rate: O2 Flow Rate (L/min): 1  L/min  Intake/output summary:  Intake/Output Summary (Last 24 hours) at 12/07/2023 1237 Last data filed at 12/07/2023 1100 Gross per 24 hour  Intake 1035.24 ml  Output 1575 ml  Net -539.76 ml   LBM: Last BM Date :  (PTA) Baseline Weight: Weight: 80 kg Most recent weight: Weight: 80 kg       Palliative Assessment/Data: PPS 20%      Patient Active Problem List   Diagnosis Date Noted   On mechanically assisted ventilation (HCC) 12/04/2023   Paroxysmal atrial fibrillation (HCC) 12/04/2023   Stroke (HCC) 12/01/2023   Acute right MCA stroke (HCC) 12/01/2023   Hypertensive emergency 04/30/2016   Headache 04/30/2016   CKD (chronic kidney disease)    Acute nonintractable headache    Primary localized osteoarthrosis of the knee, left    Radicular syndrome of left leg    Osteoarthritis of lumbar spine    Vitamin D  deficiency    GERD (gastroesophageal reflux disease)    Depression    Pre-operative cardiovascular examination 03/16/2016   Essential hypertension, benign 03/16/2016   Abnormal EKG 03/16/2016    Palliative Care Assessment &  Plan   HPI: 89 y.o. female  with past medical history of CKD, depression, A-fib, GERD, and hypertension admitted on 12/01/2023 with acute right MCA stroke.  Intubated for airway protection. Status post IR thrombectomy and stent placement. Extubated with plan to not reintubate, transition to comfort is not doing well s/p extubation. Patient has tolerated extubation so far. PMT consulted to discuss GOC. SLP eval pending.   Assessment: Follow up today with patient. Mental status seems slightly worse. Poor PO intake.  Call to son and arranged family meeting. Met with son and his wife. We reviewed that unfortunately patient is not improving.  We reviewed the concern about her p.o. intake and that she likely will not be able to support her nutritional needs.  We discussed options moving forward including artificial nutrition support versus focusing on  comfort.  Initially family shares they would like to continue current care and plan for temporary feeding tube placement.  They do share their concerns about this as they share patient was vocal in the past that she would not want a feeding tube however they would like to try a temporary one. Call to Dr. Jerrie who joins our family meeting.  She shares with family the size of patient's stroke and unlikeliness of any improvement in the coming days.  She shares concern that proceeding with feeding tube placement will not change ultimate outcome.  Family shares understanding and agrees to comfort measures only. We review what comfort measures entails -focusing nursing care and medications on what the patient needs to remain comfortable and avoiding any medical interventions or unnecessary medications.  They expressed understanding and agree. We reviewed the option of a hospice facility.  Family has some interest in this but would like to consider before agreeing.  They are most interested in hospice facility in [facility].  They may go tour her before making decision.  Recommendations/Plan: Transition to comfort measures only, DC measures not needed to promote comfort Transfer out of ICU Family considering transfer to [facility] hospice facility PMT to follow-up 10/19  Code Status: DNR  Discharge Planning: To Be Determined  Care plan was discussed with RN, son, daughter-in-law, Dr. Jerrie  Thank you for allowing the Palliative Medicine Team to assist in the care of this patient.   Total Time 65 minutes Prolonged Time Billed  no   Time spent includes: Detailed review of medical records (labs, imaging, vital signs), medically appropriate exam, discussion with treatment team, counseling and educating patient, family and/or staff, documenting clinical information, medication management and coordination of care.     *Please note that this is a verbal dictation therefore any spelling or grammatical  errors are due to the Dragon Medical One system interpretation.  Tobey Jama Barnacle, DNP, [facility] Palliative Medicine Team Team Phone # [phone]  Pager [phone]

## 2023-12-07 NOTE — Progress Notes (Signed)
 Labile BP , now sustaining over 160. Discussed with Dr Voncile, will give 1 time dose of labetalol now

## 2023-12-08 DIAGNOSIS — I63511 Cerebral infarction due to unspecified occlusion or stenosis of right middle cerebral artery: Secondary | ICD-10-CM | POA: Diagnosis not present

## 2023-12-08 DIAGNOSIS — I4819 Other persistent atrial fibrillation: Secondary | ICD-10-CM | POA: Diagnosis not present

## 2023-12-08 DIAGNOSIS — R233 Spontaneous ecchymoses: Secondary | ICD-10-CM | POA: Diagnosis not present

## 2023-12-08 DIAGNOSIS — Z7189 Other specified counseling: Secondary | ICD-10-CM | POA: Diagnosis not present

## 2023-12-08 DIAGNOSIS — Z515 Encounter for palliative care: Secondary | ICD-10-CM | POA: Diagnosis not present

## 2023-12-08 NOTE — Progress Notes (Signed)
 STROKE TEAM PROGRESS NOTE    SIGNIFICANT HOSPITAL EVENTS 10/12- Code stroke, IR 10/15-patient extubated 10/17- MBS passed, can have meds crushed  10/18- not managing adequate PO intake, transitioned to comfort care  INTERIM HISTORY/SUBJECTIVE Too sleepy to take much PO  OBJECTIVE   Current vital signs: BP (!) 179/105 (BP Location: Left Arm)   Pulse 96   Temp 98.2 F (36.8 C) (Oral)   Resp 16   Ht 5' 9 (1.753 m)   Wt 80 kg   SpO2 100%   BMI 26.05 kg/m  Vital signs in last 24 hours: Temp:  [97.8 F (36.6 C)-98.7 F (37.1 C)] 98.2 F (36.8 C) (10/19 0601) Pulse Rate:  [79-111] 96 (10/19 0601) Resp:  [11-31] 16 (10/19 0601) BP: (96-179)/(66-105) 179/105 (10/19 0601) SpO2:  [93 %-100 %] 100 % (10/19 0601)   Physical Exam  Constitutional: Ill-appearing elderly patient in no acute distress Psych: Affect blunted Eyes: No scleral injection.  HENT: Tongue and mucus membranes are dry  Head: Normocephalic.  Respiratory: Regular, unlabored respirations on room air   Neuro: Mental Status: Patient is drowsy but opens eyes to voice and is able to state name, requires repeated stimulation to follow commands    Left facial droop noted, able to track examiner around the room (does cross midline today) She moves her right upper extremity to command with antigravity strength and follows commands with right lower extremity Minimal left sided arm/leg movement Able to swallow one bite of food after extended attempts and delay   ASSESSMENT/PLAN  [name] is a 89 y.o. female with history of CKD, depression, persistent afib, GERD, HTN, osteoarthritis, vitamin D  deficiency presenting as a code stroke via EMS.  NIH on Admission 25  Stroke:  right large MCA territory infarct with confluent petechial hemorrhage due to right M1 occlusion s/p IR with TICI2b, etiology: Likely Afib not on AC   Transitioned to comfort care on 10/18, pending hospice bed  [name] MD-PhD [facility] [phone]  I personally spent a total of 30 minutes in the care of the patient today including preparing to see the patient, getting/reviewing separately obtained history, performing a medically appropriate exam/evaluation, counseling and educating, referring and communicating with other health care professionals, and documenting clinical information in the EHR.

## 2023-12-08 NOTE — Progress Notes (Signed)
 Palliative Medicine Inpatient Follow Up Note HPI: 89 y.o. female with past medical history of CKD, depression, A-fib, GERD, and hypertension admitted on 12/01/2023 with acute right MCA stroke. Intubated for airway protection. Status post IR thrombectomy and stent placement. Extubated with plan to not reintubate, transition to comfort is not doing well s/p extubation. Patient has tolerated extubation so far. PMT consulted to discuss GOC.   The decision was made by patients family yesterday to transition the emphasis of care to comfort measures.   Today's Discussion 12/08/2023  *Please note that this is a verbal dictation therefore any spelling or grammatical errors are due to the Dragon Medical One system interpretation.  Chart reviewed inclusive of vital signs, progress notes, laboratory results, and diagnostic images. Heather Holt has required x2 doses of morphine 1mg  IVP x2 in the last 24 hours.   I met at bedside with Heather Holt at bedside this morning. She is alert and responsive to me when spoken to. She shares that she is having some LLE pain. I was able to move her L leg to a position of greater comfort. Heather Holt shares that she is feeling hungry. I was able to request nursing and NT help to feed her and she was able to eat.   I called patients son, Heather Holt this morning. We discussed Heather Holt's current condition. We reviewed that she is alert this morning and is not experiencing distress. Created space and opportunity for patient son, Heather Holt to explore thoughts feelings and fears regarding current medical situation. He shares awareness and understanding of her current medical state. He wants her comfort optimized at this time.   We reviewed the option(s) for transition to an inpatient hospice home. I described an inpatient hospice home as a residential facility that provides a higher level of intensive care for terminally ill patients. They offer 24/7 medical supervision and support, with a  focus on managing symptoms like pain, nausea,  shortness of breath, and other complex needs. The goals would be to maintain comfort and dignity until the end of life. Patients family understand and would like for Heather Holt to transition to [facility] hospice home.   Questions and concerns addressed/Palliative Support Provided.   Objective Assessment: Vital Signs Vitals:   12/07/23 1802 12/08/23 0601  BP: 113/66 (!) 179/105  Pulse: (!) 111 96  Resp: 16 16  Temp: 97.8 F (36.6 C) 98.2 F (36.8 C)  SpO2: 97% 100%    Intake/Output Summary (Last 24 hours) at 12/08/2023 9282 Last data filed at 12/08/2023 9390 Gross per 24 hour  Intake 487.74 ml  Output 1490 ml  Net -1002.26 ml   Last Weight  Most recent update: 12/01/2023 11:33 AM    Weight  80 kg (176 lb 5.9 oz)            Gen:  Elderly AA F chronically ill appearing HEENT: moist mucous membranes CV: Regular rate and rhythm  PULM: On RA, breathing is even and nonlabored ABD: soft/nontender  EXT: Pedaledema  Neuro: Alert and oriented x2  SUMMARY OF RECOMMENDATIONS   DNAR/DNI  Comfort Care  Medications per [facility]  Referral for [facility] inpatient hosp[facility]  Emotional support provided  Ongoing PMT involvement   ______________________________________________________________________________________ Heather Holt Palliative Medicine Team Team Cell Phone: [phone] Please utilize secure chat with additional questions, if there is no response within 30 minutes please call the above phone number  Time Spent: 50 Billing based on MDM: High --> Review of  parenterally controlled substances, not changes required at this time. Education on EOL  Palliative Medicine Team providers are available by phone from 7am to 7pm daily and can be reached through the team cell phone.  Should this patient require assistance outside of these hours, please call the patient's attending  physician.

## 2023-12-08 NOTE — Discharge Summary (Incomplete)
 Stroke Discharge Summary  Patient ID: Heather Holt   MRN: [id number]      DOB: August 05, 1930  Date of Admission: 12/01/2023 Date of Discharge: 12/09/2023  Attending Physician:  Rosemarie Rothman MD Consultant(s):    neurology and palliative medicine  Patient's PCP:  Godwin Shed, MD  DISCHARGE PRIMARY DIAGNOSIS:  Stroke: right large MCA territory infarct with confluent petechial hemorrhage and cytotoxic cerebral edema and trans falcine right-to-left brain herniation due to right M1 occlusion s/p mechanical thrombectomy with TICI2b revascularization, etiology: Likely Afib not on [facility]   Patient Active Problem List   Diagnosis Date Noted   Hospice care patient 12/09/2023   On mechanically assisted ventilation (HCC) 12/04/2023   Paroxysmal atrial fibrillation (HCC) 12/04/2023   Stroke (HCC) 12/01/2023   Acute right MCA stroke (HCC) 12/01/2023   Hypertensive emergency 04/30/2016   Headache 04/30/2016   CKD (chronic kidney disease)    Acute nonintractable headache    Primary localized osteoarthrosis of the knee, left    Radicular syndrome of left leg    Osteoarthritis of lumbar spine    Vitamin D  deficiency    GERD (gastroesophageal reflux disease)    Depression    Pre-operative cardiovascular examination 03/16/2016   Essential hypertension, benign 03/16/2016   Abnormal EKG 03/16/2016     Allergies as of 12/09/2023       Reactions   Norvasc [amlodipine] Swelling        Medication List     STOP taking these medications    acetaminophen  500 MG tablet Commonly known as: TYLENOL    atorvastatin 20 MG tablet Commonly known as: LIPITOR   carvedilol 25 MG tablet Commonly known as: COREG   dabigatran 75 MG Caps capsule Commonly known as: PRADAXA   famotidine 20 MG tablet Commonly known as: PEPCID   ferrous sulfate 325 (65 FE) MG EC tablet   hydrALAZINE  100 MG tablet Commonly known as: APRESOLINE    sertraline 25 MG tablet Commonly known as: ZOLOFT   torsemide 20  MG tablet Commonly known as: DEMADEX   VITAMIN D -3 PO       TAKE these medications    antiseptic oral rinse Liqd Apply 15 mLs topically as needed for dry mouth.   mouth rinse Liqd solution 15 mLs by Mouth Rinse route as needed (oral care).   glycopyrrolate 0.2 MG/ML injection Commonly known as: ROBINUL Inject 1 mL (0.2 mg total) into the vein every 4 (four) hours as needed (excessive secretions).   LORazepam 2 MG/ML injection Commonly known as: ATIVAN Inject 0.25 mLs (0.5 mg total) into the vein every 4 (four) hours as needed for anxiety.   morphine (PF) 2 MG/ML injection Inject 0.5 mLs (1 mg total) into the vein every hour as needed (To alleviate signs and symptoms of distress).   ondansetron  4 MG/2ML Soln injection Commonly known as: ZOFRAN  Inject 2 mLs (4 mg total) into the vein every 6 (six) hours as needed for nausea or vomiting.        LABORATORY STUDIES CBC    Component Value Date/Time   WBC 7.0 12/07/2023 0545   RBC 4.54 12/07/2023 0545   HGB 12.6 12/07/2023 0545   HCT 40.6 12/07/2023 0545   PLT 176 12/07/2023 0545   MCV 89.4 12/07/2023 0545   MCH 27.8 12/07/2023 0545   MCHC 31.0 12/07/2023 0545   RDW 14.6 12/07/2023 0545   LYMPHSABS 0.6 (L) 12/01/2023 1128   MONOABS 0.2 12/01/2023 1128   EOSABS 0.0 12/01/2023 1128  BASOSABS 0.0 12/01/2023 1128   CMP    Component Value Date/Time   NA 147 (H) 12/07/2023 0545   K 3.6 12/07/2023 0545   CL 112 (H) 12/07/2023 0545   CO2 21 (L) 12/07/2023 0545   GLUCOSE 111 (H) 12/07/2023 0545   BUN 27 (H) 12/07/2023 0545   CREATININE 1.09 (H) 12/07/2023 0545   CALCIUM 9.1 12/07/2023 0545   PROT 7.2 12/01/2023 1128   ALBUMIN 3.8 12/01/2023 1128   AST 22 12/01/2023 1128   ALT 16 12/01/2023 1128   ALKPHOS 61 12/01/2023 1128   BILITOT 1.6 (H) 12/01/2023 1128   GFRNONAA 48 (L) 12/07/2023 0545   GFRAA 35 (L) 07/19/2016 0938   COAGS Lab Results  Component Value Date   INR 1.1 12/01/2023   INR 1.05 07/19/2016    INR 1.00 04/20/2016   Lipid Panel    Component Value Date/Time   CHOL 126 12/02/2023 0546   TRIG 80 12/02/2023 0546   HDL 49 12/02/2023 0546   CHOLHDL 2.6 12/02/2023 0546   VLDL 16 12/02/2023 0546   LDLCALC 61 12/02/2023 0546   HgbA1C  Lab Results  Component Value Date   HGBA1C 5.5 12/02/2023   Alcohol Level    Component Value Date/Time   [facility] <15 12/01/2023 1128     SIGNIFICANT DIAGNOSTIC STUDIES DG Swallowing Func-Speech Pathology Result Date: 12/06/2023 Table formatting from the original result was not included. Modified Barium Swallow Study Patient Details Name: [name] MRN: [id number] Date of Birth: Dec 03, 1930 Today's Date: 12/06/2023 HPI/PMH: HPI: Ms. Stevi Hollinshead is a 89 y.o. female with history of CKD, depression, persistent afib, GERD, HTN, osteoarthritis, vitamin D  deficiency presenting as a code stroke via EMS.  NIH on Admission 25. Found to have right large MCA territory infarct with confluent petechial hemorrhage due to right M1 occlusion s/p IR with TICI2b and underwent RMCA thrombextomy 10/12. Intubated 10/12-10/15. Clinical Impression: Clinical Impression: Pt presents with moderate oropharyngeal dysphagia and suspected esophageal involvement. Pt unable to achieve labial closure with majority of bolus falling anteriorally from oral cavity with cup facilitated by slightly reclined posture and teaspoon presentations. Oral transport was slow and disorganized with lingual residue spilling into pharynx. Solid not administered. Laryngeal penetration and aspiration due to multifactorial components of decreased oral control/clearance, partial laryngeal elevation, incomplete glottal closure and pharyngeal residue.   Thin spilled from oral cavity onto vocal cords with trace silent aspiration during the swallow (PAS 8). Pyriform sinus residue from nectar penerated cords during the swallow (PAS 5), then was silently aspirated during subsequent swallows (PAS 8). Further  instances of penetration with nectar (PAS 3) observed with return of fluoro presumed from lingual residuals. Cues for hard coughs were effective clearing penetrates and aspirated material was not detected and assumed to be ejected however unable to confirm. There was intermitent ascension of barium from PES due to suspected premature closure of PES. In addition esophageal scan revealed significant residue mid esophagus. Recommend Dys 1, nectar thick via teaspoon, hard coughs during swallow, monitor for oral residue, crush pills, full assist and supervision and remain upright after meals. There is continued mild aspiration risk which was discussed with neurologist and Palliative care NP and pt's son and daughter-in-law who were in agreement with recommendations. Provided further education re: recommendations with family. ST will continue to follow. Factors that may increase risk of adverse event in presence of aspiration [name] & [name] 2021): Factors that may increase risk of adverse event in presence of aspiration [name] & [name] 2021):  Reduced cognitive function; Frail or deconditioned; Inadequate oral hygiene; Dependence for feeding and/or oral hygiene; Limited mobility Recommendations/Plan: Swallowing Evaluation Recommendations Swallowing Evaluation Recommendations Recommendations: PO diet PO Diet Recommendation: Dysphagia 1 (Pureed); Mildly thick liquids (Level 2, nectar thick) Liquid Administration via: Spoon Medication Administration: Crushed with puree Supervision: Full assist for feeding; Full supervision/cueing for swallowing strategies Swallowing strategies  : Slow rate; Small bites/sips; Check for pocketing or oral holding; Check for anterior loss; Hard cough after swallowing; Minimize environmental distractions Postural changes: Position pt fully upright for meals; Stay upright 30-60 min after meals Oral care recommendations: Oral care BID (2x/day) Caregiver Recommendations: Avoid jello, ice cream,  thin soups, popsicles; Have oral suction available Treatment Plan Treatment Plan Treatment recommendations: Therapy as outlined in treatment plan below Follow-up recommendations: Acute inpatient rehab (3 hours/day) Functional status assessment: Patient has had a recent decline in their functional status and demonstrates the ability to make significant improvements in function in a reasonable and predictable amount of time. Treatment frequency: Min 2x/week Treatment duration: 2 weeks Interventions: Aspiration precaution training; Compensatory techniques; Patient/family education; Trials of upgraded texture/liquids; Diet toleration management by SLP Recommendations Recommendations for follow up therapy are one component of a multi-disciplinary discharge planning process, led by the attending physician.  Recommendations may be updated based on patient status, additional functional criteria and insurance authorization. Assessment: Orofacial Exam: Orofacial Exam Oral Cavity: Oral Hygiene: Pooled secretions (MILD) Oral Cavity - Dentition: Missing dentition; Edentulous (DENTURES NOT DONNED) Orofacial Anatomy: Other (comment) Oral Motor/Sensory Function: Suspected cranial nerve impairment CN V - Trigeminal: Not tested CN VII - Facial: Left motor impairment CN IX - Glossopharyngeal, CN X - Vagus: Not tested CN XII - Hypoglossal: Left motor impairment Anatomy: Anatomy: Suspected cervical osteophytes Boluses Administered: Boluses Administered Boluses Administered: Thin liquids (Level 0); Mildly thick liquids (Level 2, nectar thick); Moderately thick liquids (Level 3, honey thick); Puree  Oral Impairment Domain: Oral Impairment Domain Lip Closure: -- (unable to elicit labial closure) Tongue control during bolus hold: Escape to lateral buccal cavity/floor of mouth Bolus preparation/mastication: -- (not given solid) Bolus transport/lingual motion: Repetitive/disorganized tongue motion Oral residue: Residue collection on oral  structures Location of oral residue : Tongue Initiation of pharyngeal swallow : Posterior laryngeal surface of the epiglottis; Pyriform sinuses  Pharyngeal Impairment Domain: Pharyngeal Impairment Domain Soft palate elevation: No bolus between soft palate (SP)/pharyngeal wall (PW) Laryngeal elevation: Partial superior movement of thyroid cartilage/partial approximation of arytenoids to epiglottic petiole Anterior hyoid excursion: Complete anterior movement Epiglottic movement: Complete inversion Laryngeal vestibule closure: Incomplete, narrow column air/contrast in laryngeal vestibule Pharyngeal stripping wave : Present - complete Pharyngeal contraction (A/P view only): N/A Pharyngoesophageal segment opening: Partial distention/partial duration, partial obstruction of flow Tongue base retraction: Trace column of contrast or air between tongue base and PPW Pharyngeal residue: Collection of residue within or on pharyngeal structures Location of pharyngeal residue: Tongue base; Valleculae; Pyriform sinuses  Esophageal Impairment Domain: Esophageal Impairment Domain Esophageal clearance upright position: Esophageal retention (significant in mid esophagus) Pill: No data recorded Penetration/Aspiration Scale Score: Penetration/Aspiration Scale Score 1.  Material does not enter airway: Puree; Moderately thick liquids (Level 3, honey thick) 2.  Material enters airway, remains ABOVE vocal cords then ejected out: Mildly thick liquids (Level 2, nectar thick) 5.  Material enters airway, CONTACTS cords and not ejected out: Mildly thick liquids (Level 2, nectar thick) 8.  Material enters airway, passes BELOW cords without attempt by patient to eject out (silent aspiration) : Thin liquids (Level 0); Mildly thick  liquids (Level 2, nectar thick) Compensatory Strategies: Compensatory Strategies Compensatory strategies: Yes Reclining posture: Effective Effective Reclining Posture: Mildly thick liquid (Level 2, nectar thick) (assisted  in decreased oral spill) Other(comment): Effective (hard cough)   General Information: Caregiver present: No  Diet Prior to this Study: NPO (ice chips)   Temperature : Normal   Respiratory Status: WFL   Supplemental O2: None (Room air)   History of Recent Intubation: Yes  Behavior/Cognition: Cooperative; Pleasant mood; Other (Comment) (awake, intermittent cues for increased arousal) Self-Feeding Abilities: Dependent for feeding Baseline vocal quality/speech: Hypophonia/low volume Volitional Cough: -- (moderate) Volitional Swallow: Unable to elicit (attempted but unable) Exam Limitations: No limitations Goal Planning: Prognosis for improved oropharyngeal function: Fair Barriers to Reach Goals: Cognitive deficits; Severity of deficits No data recorded No data recorded Consulted and agree with results and recommendations: Family member/caregiver; Pt unable/family or caregiver not available Pain: Pain Assessment Pain Assessment: Faces Faces Pain Scale: 4 Facial Expression: 0 Body Movements: 0 Muscle Tension: 0 Compliance with ventilator (intubated pts.): N/A Vocalization (extubated pts.): 0 CPOT Total: 0 Pain Location: L knee with extension Pain Descriptors / Indicators: Discomfort; Grimacing; Guarding; Sore; Moaning Pain Intervention(s): Limited activity within patient's tolerance; Monitored during session; Repositioned End of Session: Start Time:SLP Start Time (ACUTE ONLY): 1243 Stop Time: SLP Stop Time (ACUTE ONLY): 1300 Time Calculation:SLP Time Calculation (min) (ACUTE ONLY): 17 min Charges: SLP Evaluations $ SLP Speech Visit: 1 Visit SLP Evaluations $BSS Swallow: 1 Procedure $MBS Swallow: 1 Procedure SLP visit diagnosis: SLP Visit Diagnosis: Dysphagia, oropharyngeal phase (R13.12); Dysphagia, pharyngoesophageal phase (R13.14) Past Medical History: Past Medical History: Diagnosis Date  CKD (chronic kidney disease)   Depression   GERD (gastroesophageal reflux disease)   History of kidney stones   Hypertension    Osteoarthritis of lumbar spine   Primary localized osteoarthrosis of the knee, left   Radicular syndrome of left leg   Vitamin D  deficiency  Past Surgical History: Past Surgical History: Procedure Laterality Date  BACK SURGERY  1969  COLONOSCOPY    EYE SURGERY Bilateral   cataract surgery  RADIOLOGY WITH ANESTHESIA N/A 12/01/2023  Procedure: RADIOLOGY WITH ANESTHESIA;  Surgeon: Radiologist, Medication, MD;  Location: [facility] OR;  Service: Radiology;  Laterality: N/A; Dustin Olam Bull 12/06/2023, 5:16 PM  DG CHEST PORT 1 VIEW Result Date: 12/03/2023 EXAM: 1 VIEW(S) XRAY OF THE CHEST 12/03/2023 10:16:00 AM COMPARISON: None available. CLINICAL HISTORY: History of ETT. Reason for exam: history of ETT. FINDINGS: LINES, TUBES AND DEVICES: Endotracheal tube and grossly good position nasogastric tube seen in each stomach. LUNGS AND PLEURA: Right upper lobe nodular density is noted. CT scan is recommended. No pulmonary edema. No pleural effusion. No pneumothorax. HEART AND MEDIASTINUM: Mild cardiomegaly. BONES AND SOFT TISSUES: No acute osseous abnormality. IMPRESSION: 1. Right upper lobe pulmonary nodule. Recommend chest CT for further characterization. 2. Mild cardiomegaly. Electronically signed by: Lynwood Seip MD 12/03/2023 10:48 AM EDT RP Workstation: [id number]   CT HEAD POST STROKE FOLLOWUP/TIMED/STAT READ Result Date: 12/03/2023 EXAM: CT HEAD WITHOUT 12/03/2023 03:16:34 AM TECHNIQUE: CT of the head was performed without the administration of intravenous contrast. Automated exposure control, iterative reconstruction, and/or weight based adjustment of the mA/kV was utilized to reduce the radiation dose to as low as reasonably achievable. COMPARISON: Prior MRI. CLINICAL HISTORY: Neuro deficit, acute, stroke suspected. Neuro deficit, acute, stroke suspected. Neuro deficit, acute, stroke suspected. Neuro deficit, acute, stroke suspected. FINDINGS: BRAIN AND VENTRICLES: Continued interval evolution of previously  identified right MCA distribution infarct, stable in  size and distribution from prior. Superimposed areas of hemorrhage also similar to prior MRI. Largest focus of hemorrhage seen at the right lentiform nucleus and measures up to 3.5 cm. Regional mass effect with partial effacement of the right lateral ventricle but no significant midline shift. Basilar cisterns remain patent. No extra-axial fluid collection. No hydrocephalus. No other new acute intracranial abnormality. Underlying moderate chronic microvascular ischemic disease noted. ORBITS: No acute abnormality. SINUSES AND MASTOIDS: Associated trace right mastoid effusion. SOFT TISSUES AND SKULL: Lipoma noted at the left frontal scalp. Patient is intubated with endotracheal and enteric tubes partially visualized. No acute skull fracture. IMPRESSION: 1. Continued interval evolution of Right MCA territory infarct with superimposed intraparenchymal hemorrhage, overall relatively similar as compared to previous MRI. 2. Regional mass effect with partial effacement of the right lateral ventricle but no significant midline shift at this time. 3. No other new acute intracranial abnormality. Electronically signed by: Morene Hoard MD 12/03/2023 03:32 AM EDT RP Workstation: [id number]   DG Abd Portable 1V Result Date: 12/02/2023 EXAM: 1 VIEW XRAY OF THE ABDOMEN 12/02/2023 06:45:31 PM COMPARISON: CT abdomen and pelvis 10/04/2016. CLINICAL HISTORY: Encounter for orogastric (OG) tube placement. FINDINGS: LINES, TUBES AND DEVICES: Enteric tube coursing below the diaphragm with distal tip and side port terminating within the expected location of the stomach. BOWEL: Nonobstructive bowel gas pattern. SOFT TISSUES: No opaque urinary calculi. BONES: No acute osseous abnormality. VASCULATURE: Atherosclerotic calcifications of the aorta. IMPRESSION: 1. Enteric tube in appropriate position with distal tip and side port terminating within the expected location of the  stomach. Electronically signed by: Donnice Mania MD 12/02/2023 09:09 PM EDT RP Workstation: [id number]   MR ANGIO HEAD WO CONTRAST Result Date: 12/02/2023 EXAM: MR Angiography Head without intravenous Contrast. 12/02/2023 12:56:00 PM TECHNIQUE: Magnetic resonance angiography images of the head without intravenous contrast. Multiplanar 2D and 3D reformatted images are provided for review. COMPARISON: CTA head and neck 12/01/2023. CLINICAL HISTORY: Stroke, follow up. FINDINGS: ANTERIOR CIRCULATION: Mild atherosclerotic irregularity of the carotid siphons. No significant stenosis of the anterior cerebral arteries. Interval recanalization of the M1 segment of the right MCA compared to the recent prior CTA. On the current study, the MCA and distal branches appear patent without evidence of high grade stenosis. No aneurysm. POSTERIOR CIRCULATION: No significant stenosis of the posterior cerebral arteries. No significant stenosis of the basilar artery. No significant stenosis of the vertebral arteries. No aneurysm. IMPRESSION: 1. Interval recanalization of the M1 segment of the right MCA compared to the recent prior CTA. The MCA and distal branches appear patent without evidence of high-grade stenosis. Electronically signed by: Donnice Mania MD 12/02/2023 03:42 PM EDT RP Workstation: [id number]   MR BRAIN WO CONTRAST Result Date: 12/02/2023 EXAM: MR Brain without Intravenous Contrast. CLINICAL HISTORY: Stroke, follow up. TECHNIQUE: Magnetic resonance images of the brain without intravenous contrast in multiple planes. CONTRAST: Without. COMPARISON: CT head and CTA head and neck 12/01/2023. FINDINGS: BRAIN: Restricted diffusion throughout the right MCA territory compatible with acute infarct. There are additional heterogeneous areas of susceptibility throughout the right MCA territory particularly within the right frontal operculum and right corona radiata as well as the right basal ganglia concerning for hemorrhagic  conversion. There is associated edema throughout the region of infarct. There is slightly increased mass effect on the right lateral ventricle without significant midline shift. Scattered T2 FLAIR hyperintensity in the periventricular and subcortical white matter as well as signal abnormality in the pons compatible with chronic microvascular ischemic changes. No extra-axial fluid  collection. No cerebellar tonsillar ectopia. The central arterial and venous flow voids are patent. VENTRICLES: Slightly increased mass effect on the right lateral ventricle. No hydrocephalus. ORBITS: Bilateral lens replacement. SINUSES AND MASTOIDS: Mastoid effusion. The sinuses are clear. BONES: No acute fracture or focal osseous lesion. LIMITATIONS/ARTIFACTS: Multiple sequences are limited by motion artifact. SOFT TISSUES: Left frontal subgaleal lipoma. IMPRESSION: 1. Acute infarct in the right MCA territory with associated edema and slightly increased mass effect on the right lateral ventricle without significant midline shift. 2. Heterogeneous areas of susceptibility in the right MCA territory concerning for petechial hemorrhage. Hemorrhagic conversion is considered less likely. Recommend CT head for further evaluation. 3. Chronic microvascular ischemic changes. Electronically signed by: Donnice Mania MD 12/02/2023 03:25 PM EDT RP Workstation: [id number]   ECHOCARDIOGRAM COMPLETE Result Date: 12/02/2023    ECHOCARDIOGRAM REPORT   Patient Name:   Omer Limberg Date of Exam: 12/02/2023 Medical Rec #:  [id number]      Height:       69.0 in Accession #:    [id number]     Weight:       176.4 lb Date of Birth:  02/12/1931      BSA:          1.958 m Patient Age:    89 years       BP:           148/84 mmHg Patient Gender: F              HR:           100 bpm. Exam Location:  Inpatient Procedure: 2D Echo, Cardiac Doppler and Color Doppler (Both Spectral and Color            Flow Doppler were utilized during procedure). Indications:    Stroke   History:        Patient has no prior history of Echocardiogram examinations.                 Stroke, Arrythmias:Atrial Fibrillation; Risk                 Factors:Hypertension.  Sonographer:    BERNARDA ROCKS Referring Phys: [id number] DEVON SHAFER IMPRESSIONS  1. Left ventricular ejection fraction, by estimation, is 60 to 65%. The left ventricle has normal function. Left ventricular endocardial border not optimally defined to evaluate regional wall motion. There is mild left ventricular hypertrophy. Left ventricular diastolic function could not be evaluated.  2. Right ventricular systolic function is normal. The right ventricular size is normal. Tricuspid regurgitation signal is inadequate for assessing PA pressure.  3. Left atrial size was severely dilated.  4. Right atrial size was moderately dilated.  5. A small pericardial effusion is present. The pericardial effusion is anterior to the right ventricle.  6. The mitral valve is grossly normal. Trivial mitral valve regurgitation. No evidence of mitral stenosis.  7. The aortic valve was not well visualized. Aortic valve regurgitation is not visualized. Aortic valve sclerosis is present, with no evidence of aortic valve stenosis.  8. The inferior vena cava is normal in size with <50% respiratory variability, suggesting right atrial pressure of 8 mmHg.  9. Cannot exclude a small PFO. Comparison(s): No prior Echocardiogram. Conclusion(s)/Recommendation(s): No intracardiac source of embolism detected on this transthoracic study. Consider a transesophageal echocardiogram to exclude cardiac source of embolism if clinically indicated. FINDINGS  Left Ventricle: Left ventricular ejection fraction, by estimation, is 60 to 65%. The left ventricle has normal function. Left ventricular endocardial border  not optimally defined to evaluate regional wall motion. The left ventricular internal cavity size was normal in size. There is mild left ventricular hypertrophy. Left ventricular  diastolic function could not be evaluated due to atrial fibrillation. Left ventricular diastolic function could not be evaluated. Right Ventricle: The right ventricular size is normal. Right vetricular wall thickness was not well visualized. Right ventricular systolic function is normal. Tricuspid regurgitation signal is inadequate for assessing PA pressure. Left Atrium: Left atrial size was severely dilated. Right Atrium: Right atrial size was moderately dilated. Pericardium: A small pericardial effusion is present. The pericardial effusion is anterior to the right ventricle. Mitral Valve: The mitral valve is grossly normal. Trivial mitral valve regurgitation. No evidence of mitral valve stenosis. MV peak gradient, 4.8 mmHg. The mean mitral valve gradient is 2.0 mmHg. Tricuspid Valve: The tricuspid valve is grossly normal. Tricuspid valve regurgitation is trivial. No evidence of tricuspid stenosis. Aortic Valve: The aortic valve was not well visualized. Aortic valve regurgitation is not visualized. Aortic valve sclerosis is present, with no evidence of aortic valve stenosis. Aortic valve mean gradient measures 5.0 mmHg. Aortic valve peak gradient measures 10.4 mmHg. Aortic valve area, by VTI measures 2.73 cm. Pulmonic Valve: The pulmonic valve was not well visualized. Pulmonic valve regurgitation is not visualized. Aorta: The aortic root and ascending aorta are structurally normal, with no evidence of dilitation. Venous: The inferior vena cava is normal in size with less than 50% respiratory variability, suggesting right atrial pressure of 8 mmHg. IAS/Shunts: Cannot exclude a small PFO.  LEFT VENTRICLE PLAX 2D LVIDd:         5.00 cm LVIDs:         2.80 cm LV PW:         1.10 cm LV IVS:        1.10 cm LVOT diam:     2.00 cm LV SV:         72 LV SV Index:   37 LVOT Area:     3.14 cm  LV Volumes (MOD) LV vol d, MOD A2C: 115.0 ml LV vol d, MOD A4C: 123.0 ml LV vol s, MOD A2C: 41.9 ml LV vol s, MOD A4C: 43.5 ml LV SV  MOD A2C:     73.1 ml LV SV MOD A4C:     123.0 ml LV SV MOD BP:      80.4 ml RIGHT VENTRICLE             IVC RV Basal diam:  3.70 cm     IVC diam: 2.00 cm RV S prime:     23.50 cm/s TAPSE (M-mode): 1.5 cm LEFT ATRIUM              Index        RIGHT ATRIUM           Index LA diam:        4.30 cm  2.20 cm/m   RA Pressure: 8.00 mmHg LA Vol (A2C):   109.0 ml 55.66 ml/m  RA Area:     25.60 cm LA Vol (A4C):   107.0 ml 54.63 ml/m  RA Volume:   87.60 ml  44.73 ml/m LA Biplane Vol: 112.0 ml 57.19 ml/m  AORTIC VALVE                     PULMONIC VALVE AV Area (Vmax):    2.50 cm      PV Vmax:  1.28 m/s AV Area (Vmean):   2.29 cm      PV Peak grad:     6.6 mmHg AV Area (VTI):     2.73 cm      PR End Diast Vel: 1.84 msec AV Vmax:           161.00 cm/s AV Vmean:          100.000 cm/s AV VTI:            0.262 m AV Peak Grad:      10.4 mmHg AV Mean Grad:      5.0 mmHg LVOT Vmax:         128.00 cm/s LVOT Vmean:        72.900 cm/s LVOT VTI:          0.228 m LVOT/AV VTI ratio: 0.87  AORTA Ao Root diam: 3.40 cm Ao Asc diam:  3.40 cm MITRAL VALVE               TRICUSPID VALVE MV Area (PHT): 4.65 cm    Estimated RAP:  8.00 mmHg MV Area VTI:   3.81 cm MV Peak grad:  4.8 mmHg    SHUNTS MV Mean grad:  2.0 mmHg    Systemic VTI:  0.23 m MV Vmax:       1.10 m/s    Systemic Diam: 2.00 cm MV Vmean:      55.3 cm/s MV Decel Time: 163 msec MV E velocity: 82.00 cm/s MV A velocity: 38.60 cm/s MV E/A ratio:  2.12 Sunit Tolia Electronically signed by Madonna Large Signature Date/Time: 12/02/2023/11:36:01 AM    Final    CT ANGIO HEAD NECK W WO CM W PERF (CODE STROKE) Result Date: 12/01/2023 EXAM: CTA Head and Neck with Perfusion 12/01/2023 12:06:42 PM TECHNIQUE: CTA of the head and neck was performed without and with the administration of 100 mL of iohexol (OMNIPAQUE) 350 MG/ML injection. 3D postprocessing with multiplanar reconstructions and MIPs was performed to evaluate the vascular anatomy. Cerebral perfusion analysis using  computed tomography with contrast administration, including post-processing of parametric maps with determination of cerebral blood flow, cerebral blood volume, mean transit time and time-to-maximum. Automated exposure control, iterative reconstruction, and/or weight based adjustment of the mA/kV was utilized to reduce the radiation dose to as low as reasonably achievable. COMPARISON: None available CLINICAL HISTORY: Neuro deficit, acute, stroke suspected. FINDINGS: CTA NECK: AORTIC ARCH AND ARCH VESSELS: Atherosclerotic changes are present at the aortic arch and great vessel origins. No focal stenosis or aneurysm is present. CERVICAL CAROTID ARTERIES: No dissection, arterial injury, or hemodynamically significant stenosis by NASCET criteria. CERVICAL VERTEBRAL ARTERIES: Atherosclerotic calcifications are present bilaterally without significant stenosis. The left vertebral artery is dominant. No dissection or arterial injury. LUNGS AND MEDIASTINUM: Unremarkable. SOFT TISSUES: No acute abnormality. BONES: Multilevel degenerative changes are present in the cervical spine. CTA HEAD: ANTERIOR CIRCULATION: Atherosclerotic changes are present within the cavernous internal carotid arteries bilaterally without significant stenoses through the ICA termina. No significant stenosis of the anterior cerebral arteries. A distal right M1 occlusion is present. The posterior demonstrated branch vessels are opacified. Anterior and superior MCA branches are not opacified. Poor collateralization is present. No aneurysm. POSTERIOR CIRCULATION: No significant stenosis of the posterior cerebral arteries. No significant stenosis of the basilar artery. No significant stenosis of the vertebral arteries. No aneurysm. OTHER: A prominent right posterior communicating artery contributes. No dural venous sinus thrombosis on this non-dedicated study. CT PERFUSION: EXAM QUALITY: Exam quality is adequate  with diagnostic perfusion maps. No  significant motion artifact. Appropriate arterial inflow and venous outflow curves. CORE INFARCT (CBF<30% volume): 13 mL in the right frontal opaculum and anterior right frontal lobe. TOTAL HYPOPERFUSION (Tmax>6s volume): 94 mL across the right MCA territory. PENUMBRA: Mismatch volume: 81 mL Mismatch ratio: 7.2 Location: Right MCA territory. IMPRESSION: 1. Distal right M1 occlusion with poor collateralization of the anterior and superior divisions. 2. Core infarct of 13 ml in the right frontal operculum and anterior right frontal lobe. 3. Larger area of ischemia in the right MCA territory measuring 94 ml, with a mismatch volume of 81 ml and a mismatch ratio of 7.2. Clinical findings were called to Dr. Matthews at 12:10 pm. Electronically signed by: Lonni Necessary MD 12/01/2023 12:21 PM EDT RP Workstation: [id number]   CT HEAD CODE STROKE WO CONTRAST Result Date: 12/01/2023 EXAM: CT HEAD WITHOUT CONTRAST 12/01/2023 11:35:11 AM TECHNIQUE: CT of the head was performed without the administration of intravenous contrast. Automated exposure control, iterative reconstruction, and/or weight based adjustment of the mA/kV was utilized to reduce the radiation dose to as low as reasonably achievable. COMPARISON: 07/06/2016 CLINICAL HISTORY: Neuro deficit, acute, stroke suspected. FINDINGS: BRAIN AND VENTRICLES: No acute hemorrhage. Loss of gray-white differentiation is present in the supratentorial right frontal lobe and in the right frontal operculum. The anterior right insular cortex is obscured. The right lentiform nucleus is isoechoic to white matter. The caudate head is intact. Atrophy and other white matter changes demonstrate some suspected progression. Atherosclerotic calcifications are present in the cavernous carotid arteries bilaterally. No hyperdense vessel is present. No extra-axial collection. No mass effect or midline shift. No hydrocephalus. ORBITS: Right periorbital soft tissue swelling is present without  underlying fracture or foreign body. Bilateral lens replacements are noted. The globes and orbits are otherwise within normal limits. SINUSES: No acute abnormality. SOFT TISSUES AND SKULL: Right periorbital soft tissue swelling is present without underlying fracture or foreign body. A left supraorbital scalp lipoma is present. No skull fracture. ASPECT Score: Ganglionic (caudate, ic, lentiform nucleus, insula, M1-m3): 2 Supraganglionic (m4-m6): 4 Total: 6 IMPRESSION: 1. Acute right MCA territory infarct.  ASPECTS 6/10 2. No hyperdense vessel. 3. Atherosclerotic calcifications in the cavernous carotid arteries bilaterally. 4. Right periorbital soft tissue swelling without underlying fracture or foreign body. Question of trauma? Critical findings were called to Dr. Matthews at 11:40 am. Electronically signed by: Lonni Necessary MD 12/01/2023 11:46 AM EDT RP Workstation: [id number]       HISTORY OF PRESENT ILLNESS 89 y.o. patient with history of CKD, depression, persistent afib, GERD, HTN, osteoarthritis, vitamin D  deficiency presenting as a code stroke via EMS. NIH on Admission 25. Extubated 10/15 and initially passed MBS. Family made the decision to transition to comfort care 10/18.  Patient is now ready to be transferred to inpatient hospice for further comfort care.  HOSPITAL COURSE Stroke:  right large MCA territory infarct with confluent petechial hemorrhage due to right M1 occlusion s/p IR with TICI2b, etiology: Likely Afib not on AC   CT Head Acute right MCA territory infarct.  ASPECTS 6/10 No hyperdense vessel. Atherosclerotic calcifications in the cavernous carotid arteries bilaterally.  CT angio Head and Neck Distal right M1 occlusion with poor collateralization of the anterior and superior divisions. CTP core infarct of 13 ml and larger area of ischemia in the right MCA territory measuring 94 ml.  However core infarct was underestimated due to pseudonormalization MRI  Acute large infarct in the  right MCA territory with associated edema  and slightly increased mass effect on the right lateral ventricle without significant midline shift. Heterogeneous areas of susceptibility in the right MCA territory concerning for petechial hemorrhage.  MRA  Interval recanalization of the M1 segment of the right MCA compared to the recent prior CTA. The MCA and distal branches appear patent without evidence of high-grade stenosis. CT repeat 10/14 continued evolution of right MCA territory infarct with stable superimposed IPH, mass effect but no significant MLS 2D Echo 60 to 65%, LA severely dilated  LDL 61 HgbA1c 5.5 VTE prophylaxis - SCDs No antithrombotic prior to admission, now on ASA 81 given the confluent petechial hemorrhage. Will consider DOAC around day 7 (early next week) Therapy recommendations:  Hospice Disposition: Hospice  Atrial fibrillation Home Meds: prior auth for pradaxa was denied, does not appear to have been taking any other anticoagulation.  Now comfort care   Hypertension, labile today Home meds:  Coreg 25mg , hydralazine  100 mg 3 times daily reduce to 50 mg today, demadex (torsemide) 20 mg daily BP meds d/c'd  Blood Pressure Goal: SBP less than 160  Long-term BP goal normotensive   Hyperlipidemia Home meds: Atorvastatin 20mg  LDL 61, goal < 70 Statin d/c'd for comfort measures   Dysphagia  Post stroke dysphagia 10/17-passed MBS -puree and nectar thick liquid, crushed meds Advance diet as able   Other stroke risk factors Advanced age   Other active problems CKD Stage 3a, Cr 1.4-1.15-1.44-1.27-1.30- 1.10 - 1.01, GFR 47 -53 Hypokalemia, Replaced GERD  DISCHARGE EXAM  Constitutional: Ill-appearing elderly patient in no acute distress Psych: Affect blunted Eyes: No scleral injection.  HENT: Tongue and mucus membranes are dry  Head: Normocephalic.  Respiratory: Regular, unlabored respirations on room air  Skin: WDI.    Neuro: Mental Status: Patient is drowsy  but opens eyes to voice and touch.  She moves right upper extremity and bilateral lower extremities to stimuli.  She does not respond to her name or follow commands.  She appears comfortable.  Discharge Diet       Diet   DIET - DYS 1 Room service appropriate? No; Fluid consistency: Nectar Thick   liquids  DISCHARGE PLAN Disposition: Hospice  35 minutes were spent preparing discharge.  Cortney E Everitt Clint Kill , MSN, AGACNP-BC [facility] See Amion for schedule and pager information 12/09/2023 12:49 PM  I have personally obtained history,examined this patient, reviewed notes, independently viewed imaging studies, participated in medical decision making and plan of care.ROS completed by me personally and pertinent positives fully documented  I have made any additions or clarifications directly to the above note. Agree with note above.  Patient with large right hemispheric stroke secondary to right M1 occlusion treated with mechanical thrombectomy but unfortunately developed hemorrhagic transformation with significant cytotoxic edema and right-to-left midline shift and brain herniation with poor prognosis.  Family agreed to DNR and comfort care measures and patient was transferred to hospice nursing home for continuing hospice care.  Eather Popp, MD Medical Director [facility] Pager: [phone] 12/09/2023 1:19 PM

## 2023-12-08 NOTE — Plan of Care (Signed)
   Problem: Education: Goal: Knowledge of disease or condition will improve Outcome: Not Progressing

## 2023-12-08 NOTE — TOC Progression Note (Signed)
 Transition of Care [facility]) - Progression Note    Patient Details  Name: Heather Holt MRN: [id number] Date of Birth: [birth year]  [facility]) CM/SW Contact  Robynn Eileen Hoose, RN Phone Number: 12/08/2023, 11:13 AM  Clinical Narrative:   Secure message from Palliative Care provider regarding family wanting patient to go to [facility] possibly tomorrow. Spoke with Matilda with [facility] to place referral.      Barriers to Discharge: Continued Medical Work up, Air traffic controller and Services In-house Referral: Clinical Social Work     Living arrangements for the past 2 months: Single Family Home                                       Social Drivers of Health (SDOH) Interventions SDOH Screenings   Food Insecurity: Low Risk  (10/17/2023)   Received from [facility]  Housing: Low Risk  (10/17/2023)   Received from [facility]  Transportation Needs: No Transportation Needs (10/17/2023)   Received from [facility]  Utilities: Low Risk  (10/17/2023)   Received from [facility]  Financial Resource Strain: Low Risk  (08/29/2018)   Received from [facility] visits prior to 04/21/2022.  Tobacco Use: Medium Risk (12/01/2023)    Readmission Risk Interventions     No data to display

## 2023-12-08 NOTE — Plan of Care (Signed)
  Problem: Skin Integrity: Goal: Risk for impaired skin integrity will decrease Outcome: Progressing   Problem: Pain Management: Goal: Satisfaction with pain management regimen will improve Outcome: Progressing

## 2023-12-09 DIAGNOSIS — I63511 Cerebral infarction due to unspecified occlusion or stenosis of right middle cerebral artery: Secondary | ICD-10-CM | POA: Diagnosis not present

## 2023-12-09 DIAGNOSIS — Z515 Encounter for palliative care: Secondary | ICD-10-CM

## 2023-12-09 DIAGNOSIS — Z7189 Other specified counseling: Secondary | ICD-10-CM | POA: Diagnosis not present

## 2023-12-09 MED ORDER — ONDANSETRON HCL 4 MG/2ML IJ SOLN: 4.0000 mg | Freq: Four times a day (QID) | INTRAMUSCULAR | 0 refills | Status: AC | PRN

## 2023-12-09 MED ORDER — MORPHINE SULFATE (PF) 2 MG/ML IV SOLN: 1.0000 mg | INTRAVENOUS | 0 refills | Status: AC | PRN

## 2023-12-09 MED ORDER — LORAZEPAM 2 MG/ML IJ SOLN: 0.5000 mg | INTRAMUSCULAR | 0 refills | Status: AC | PRN

## 2023-12-09 MED ORDER — BIOTENE DRY MOUTH MT LIQD: 15.0000 mL | OROMUCOSAL | 0 refills | Status: AC | PRN

## 2023-12-09 MED ORDER — GLYCOPYRROLATE 0.2 MG/ML IJ SOLN: 0.2000 mg | INTRAMUSCULAR | 0 refills | Status: AC | PRN

## 2023-12-09 MED ORDER — ORAL CARE MOUTH RINSE: 15.0000 mL | OROMUCOSAL | 0 refills | Status: AC | PRN

## 2023-12-09 NOTE — Consult Note (Signed)
 [facility]: Pt has been approved for transfer to [facility] at [address] [place]. Her family have signed consents for transfer today and we can accept pt in transfer 2pm or after. Nurse Report: [id number]. Message left for CM Powell Rumps for coordination. Thank you for the opportunity to serve this patient and family. Please call Hampton Ege, RN at [phone] with any questions.

## 2023-12-09 NOTE — TOC Transition Note (Addendum)
 Transition of Care [facility]) - Discharge Note   Patient Details  Name: Heather Holt MRN: [id number] Date of Birth: [birth year]  [facility] CM/SW Contact:  Bora Bost E Elpidio Thielen, LCSW Phone Number: 12/09/2023, 2:03 PM   Clinical Narrative:    Patient is discharging to [facility] today. Hospice Representative Roxanne states she has updated the family and Care Team, and arranged [facility] transport.  EMS paperwork completed and placed with patient's chart.    Final next level of care: Hospice Medical Facility Barriers to Discharge: Barriers Resolved   Patient Goals and CMS Choice   CMS Medicare.gov Compare Post Acute Care list provided to:: Patient Represenative (must comment) Choice offered to / list presented to : Adult Children      Discharge Placement                Patient to be transferred to facility by: [facility] Patient and family updated by [facility] with [facility]. Patient and family notified of of transfer: 12/09/23  Discharge Plan and Services Additional resources added to the After Visit Summary for   In-house Referral: Clinical Social Work                                   Social Drivers of Health (SDOH) Interventions SDOH Screenings   Food Insecurity: Low Risk  (10/17/2023)   Received from [facility]  Housing: Low Risk  (10/17/2023)   Received from [facility]  Transportation Needs: No Transportation Needs (10/17/2023)   Received from [facility]  Utilities: Low Risk  (10/17/2023)   Received from [facility]  Financial Resource Strain: Low Risk  (08/29/2018)   Received from [facility] visits prior to 04/21/2022.  Tobacco Use: Medium Risk (12/01/2023)     Readmission Risk Interventions     No data to display

## 2023-12-09 NOTE — Plan of Care (Signed)
  Problem: Education: Goal: Knowledge of disease or condition will improve Outcome: Progressing Goal: Knowledge of secondary prevention will improve (MUST DOCUMENT ALL) Outcome: Progressing Goal: Knowledge of patient specific risk factors will improve (DELETE if not current risk factor) Outcome: Progressing   Problem: Ischemic Stroke/TIA Tissue Perfusion: Goal: Complications of ischemic stroke/TIA will be minimized Outcome: Progressing

## 2023-12-09 NOTE — Progress Notes (Signed)
   Palliative Medicine Inpatient Follow Up Note HPI: 89 y.o. female with past medical history of CKD, depression, A-fib, GERD, and hypertension admitted on 12/01/2023 with acute right MCA stroke. Intubated for airway protection. Status post IR thrombectomy and stent placement. Extubated with plan to not reintubate, transition to comfort is not doing well s/p extubation. Patient has tolerated extubation so far. PMT consulted to discuss GOC.   The decision was made by patients family yesterday to transition the emphasis of care to comfort measures.   Today's Discussion 12/09/2023  *Please note that this is a verbal dictation therefore any spelling or grammatical errors are due to the Dragon Medical One system interpretation.  Chart reviewed inclusive of vital signs, progress notes, laboratory results, and diagnostic images. Heather Holt has required  x1 dose of morphine 1mg  IVP in the last 24 hours.  On assessment this morning, Heather Holt is resting comfortably. She does not have any notable signs of distress this morning. She remains to have warm extremities. She is somnolent and not arousable.   I spoke with patients son, Heather Holt and DIL. We discussed patients current clinical state. We reviewed the plan for [facility] evaluation and potential placement thereafter.   [facility] is awaiting approval for Dessa and will update the medical team once this has been obtained.   Questions and concerns addressed/Palliative Support Provided.   Objective Assessment: Vital Signs Vitals:   12/08/23 0601 12/09/23 0316  BP: (!) 179/105 (!) 137/98  Pulse: 96 (!) 129  Resp: 16 19  Temp: 98.2 F (36.8 C) (!) 100.5 F (38.1 C)  SpO2: 100% 97%    Intake/Output Summary (Last 24 hours) at 12/09/2023 0848 Last data filed at 12/08/2023 2250 Gross per 24 hour  Intake 80 ml  Output 900 ml  Net -820 ml   Last Weight  Most recent update: 12/01/2023 11:33 AM    Weight  80 kg (176 lb  5.9 oz)            Gen:  Elderly AA F chronically ill appearing HEENT: moist mucous membranes CV: Regular rate and rhythm  PULM: On RA, breathing is even and nonlabored ABD: soft/nontender  EXT: Pedaledema  Neuro: Alert and oriented x2  SUMMARY OF RECOMMENDATIONS   DNAR/DNI  Comfort Care  Medications per [facility]  Referral [facility] inpatient [facility] home  Emotional support provided  Ongoing PMT involvement   ______________________________________________________________________________________ Heather Holt [place] Palliative Medicine Team Team Cell Phone: [phone] Please utilize secure chat with additional questions, if there is no response within 30 minutes please call the above phone number  Billing based on MDM: High --> Review of parenterally controlled substances, not changes required at this time. Education on EOL  Palliative Medicine Team providers are available by phone from 7am to 7pm daily and can be reached through the team cell phone.  Should this patient require assistance outside of these hours, please call the patient's attending physician.

## 2023-12-15 NOTE — Op Note (Signed)
 TECHNIQUE: Right middle cerebral artery mechanical thrombectomy   PREOPERATIVE DIAGNOSIS: Right middle cerebral artery occlusion POSTOPERATIVE DIAGNOSIS: Same   PROCEDURE PERFORMED:  1: Ultrasound-guided right radial artery access 2: Multivessel diagnostic angiography 3: Closure of the right right femoral artery access with Angio-Seal 4: Right middle cerebral artery mechanical thrombectomy 5: Maximum intensity projection/volume surface rendering imaging/3-D imaging   DEVICES USED:  1: 6 French sheath 2: Simmons 2 diagnostic catheter 3: 0.038 inch Glidewire 4: Closure with: 8 French Angio-Seal 5: 100 cm Zoom base catheter 6: Aspiration catheter 7: Microcatheter 8: Microwire 9: Stent retriever   VESSELS CATHETERIZED: 1: Right common femoral artery 2: Aortic arch 3: Right common carotid artery 4: Right internal carotid artery 5: Right M1 6: Right M2   ANESTHESIA: Per anesthesia service   COMPLICATIONS: None   PREOPERATIVE COURSE: Patient is a 89 year old Female with a history of finding of a right MCA occlusion was seen through our emergency services. Patient's films were reviewed with the family and options were discussed with them including but not limited to observation, versus endovascular treatment. Benefits and risks of each option were discussed with her in layman's terms with the risks of the endovascular treatment including but not limited to infection, hemorrhage, stroke, paralysis, blindness, speech impairment, renal failure, DVT, PE, cardio pulmonary complications and death amongst others. All the patient's questions were answered to their satisfaction as voiced by them and they requested for us  to proceed with endovascular treatment.   DESCRIPTION OF PROCEDURE: Patient was brought to the angio suite and after patient was positioned and all pressure points padded, patient was prepped and draped in usual sterile fashion. After injection of local anesthetic, ultrasound  guidance was used to evaluate the right radial site; patency of the right radial artery was noted and documented in PACS. Using a standard micropuncture kit with ultrasound guidance realtime visualization, the micropuncture needle was advanced into the right radial artery and intravascular location of the needle tip was confirmed on ultrasound. Thereafter using a modified Seldinger technique a 6 French sheath was inserted. The diagnostic catheter was navigated over the subclavian artery and vessels were selectively catheterized over a 0.038 inch Glidewire.  A long based catheter was used to navigate the aspiration catheter into the middle cerebral artery through which through a microcatheter a stent retriever was deployed.  Using an aspiration catheter and a stent retriever mechanical thrombectomy was performed.  After the images were obtained they were reviewed and found to be of diagnostic quality. The catheter and wire were then removed and the sheath discontinued with closure of the access site with 8 French Angio-Seal At the end of the procedure patient's needle pulses were present. Patient was transferred out of the angiosuite at neurological baseline   I was present for the entire procedure.   Series 1: Right internal carotid artery injection demonstrates internal carotid artery coursing superiorly and bifurcating into the ACA as well as the MCA.  There is an occlusion of the middle cerebral artery appreciated.   Series 2: Still short demonstrating the positioning of the aspiration catheter and the stent retriever in the right M1-M2   Series 3: Right internal carotid artery injection cranial view at this time demonstrates a partial recanalization: The superior division is patent however the inferior division has a distal thrombus   Series 4: Right internal carotid artery injection cranial view demonstrates a thrombus distal in the inferior division after a second mechanical thrombectomy.       IMPRESSION:  Right middle cerebral artery mechanical thrombectomy. TICI IIb

## 2023-12-21 DEATH — deceased

## 2023-12-31 ENCOUNTER — Other Ambulatory Visit (HOSPITAL_COMMUNITY): Payer: Self-pay | Admitting: Neurosurgery

## 2023-12-31 ENCOUNTER — Encounter (HOSPITAL_COMMUNITY): Payer: Self-pay

## 2023-12-31 DIAGNOSIS — I63131 Cerebral infarction due to embolism of right carotid artery: Secondary | ICD-10-CM

## 2023-12-31 HISTORY — PX: IR US GUIDE VASC ACCESS RIGHT: IMG2390

## 2024-01-24 NOTE — Progress Notes (Signed)
 [facility] Worker Note Stroke Post Discharge Follow-Up  Stellarose Cerny [id number]   Contact Type:    Encounter Date: 01/24/2024  Outreach Project:  Stroke post discharge follow-up Managed Medicaid Plan Participant:   PCP: Yes - See Care Teams in patient chart Payor Status: Does the patient have health insurance (Y/N): Yes Payor Name: : [facility]    [facility] Worker Documentation     Row Name 01/24/24 1239     Post-Stroke CHW Follow-Up Telephone Call   Discharge Date 12/09/23   Discharge Location [facility]   How have you been feeling since being released from the hospital? --  Unable to ask pa   Depression Screening Exception: Other- indicate reason in comment box  Unable to ask patient due to being in hospice care       Past Medical History:  Diagnosis Date   CKD (chronic kidney disease)    Depression    GERD (gastroesophageal reflux disease)    History of kidney stones    Hypertension    Osteoarthritis of lumbar spine    Primary localized osteoarthrosis of the knee, left    Radicular syndrome of left leg    Vitamin D  deficiency    Social History   Substance and Sexual Activity  Alcohol Use No   Social History   Substance and Sexual Activity  Drug Use No   Social History   Tobacco Use  Smoking Status Former   Current packs/day: 0.00   Types: Cigarettes   Quit date: 04/21/1986   Years since quitting: 37.7  Smokeless Tobacco Never    SDOH Screenings   Food Insecurity: Low Risk  (10/17/2023)   Received from [facility]  Housing: Low Risk  (10/17/2023)   Received from [facility]  Transportation Needs: No Transportation Needs (10/17/2023)   Received from [facility]  Utilities: Low Risk  (10/17/2023)   Received from [facility]  Financial Resource Strain: Low Risk  (08/29/2018)   Received from [facility] visits prior to 04/21/2022.  Tobacco Use: Medium Risk (12/01/2023)   Referrals (if applicable):         Education:    Limiting Factors:   Patient is hospice care  Follow-up:   Initial Follow Up  Follow-up Type:     Due to patient being hospice care and per health equity protocol, no additional follow up needed  Joachim Carton A Edwards Mckelvie
